# Patient Record
Sex: Female | Born: 1970 | Race: White | Hispanic: No | Marital: Married | Smoking: Never smoker
Health system: Southern US, Community
[De-identification: ages and names within clinical notes are randomized; demographics above are authoritative.]

## PROBLEM LIST (undated history)

## (undated) DIAGNOSIS — F32A Depression, unspecified: Secondary | ICD-10-CM

## (undated) DIAGNOSIS — R32 Unspecified urinary incontinence: Secondary | ICD-10-CM

## (undated) DIAGNOSIS — F329 Major depressive disorder, single episode, unspecified: Secondary | ICD-10-CM

## (undated) HISTORY — DX: Unspecified urinary incontinence: R32

## (undated) HISTORY — DX: Major depressive disorder, single episode, unspecified: F32.9

## (undated) HISTORY — DX: Depression, unspecified: F32.A

---

## 2000-02-25 ENCOUNTER — Other Ambulatory Visit: Admission: RE | Admit: 2000-02-25 | Discharge: 2000-02-25 | Payer: Self-pay | Admitting: Obstetrics and Gynecology

## 2001-03-08 ENCOUNTER — Other Ambulatory Visit: Admission: RE | Admit: 2001-03-08 | Discharge: 2001-03-08 | Payer: Self-pay | Admitting: Obstetrics and Gynecology

## 2002-03-08 ENCOUNTER — Other Ambulatory Visit: Admission: RE | Admit: 2002-03-08 | Discharge: 2002-03-08 | Payer: Self-pay | Admitting: Obstetrics and Gynecology

## 2002-09-19 ENCOUNTER — Inpatient Hospital Stay (HOSPITAL_COMMUNITY): Admission: AD | Admit: 2002-09-19 | Discharge: 2002-09-22 | Payer: Self-pay | Admitting: Obstetrics and Gynecology

## 2002-10-29 ENCOUNTER — Other Ambulatory Visit: Admission: RE | Admit: 2002-10-29 | Discharge: 2002-10-29 | Payer: Self-pay | Admitting: Obstetrics and Gynecology

## 2003-11-01 ENCOUNTER — Other Ambulatory Visit: Admission: RE | Admit: 2003-11-01 | Discharge: 2003-11-01 | Payer: Self-pay | Admitting: Obstetrics and Gynecology

## 2004-12-31 ENCOUNTER — Other Ambulatory Visit: Admission: RE | Admit: 2004-12-31 | Discharge: 2004-12-31 | Payer: Self-pay | Admitting: Obstetrics and Gynecology

## 2007-04-18 ENCOUNTER — Ambulatory Visit: Payer: Self-pay | Admitting: Urology

## 2008-05-03 HISTORY — PX: OTHER SURGICAL HISTORY: SHX169

## 2010-02-26 ENCOUNTER — Encounter: Admission: RE | Admit: 2010-02-26 | Discharge: 2010-02-26 | Payer: Self-pay | Admitting: Obstetrics and Gynecology

## 2011-04-07 ENCOUNTER — Other Ambulatory Visit: Payer: Self-pay | Admitting: Obstetrics and Gynecology

## 2011-04-07 DIAGNOSIS — R928 Other abnormal and inconclusive findings on diagnostic imaging of breast: Secondary | ICD-10-CM

## 2011-04-21 ENCOUNTER — Ambulatory Visit
Admission: RE | Admit: 2011-04-21 | Discharge: 2011-04-21 | Disposition: A | Discharge status: Home / Self Care | Payer: BC Managed Care – PPO | Source: Ambulatory Visit | Attending: Obstetrics and Gynecology | Admitting: Obstetrics and Gynecology

## 2011-04-21 DIAGNOSIS — R928 Other abnormal and inconclusive findings on diagnostic imaging of breast: Secondary | ICD-10-CM

## 2013-07-04 ENCOUNTER — Encounter: Payer: Self-pay | Admitting: Family Medicine

## 2013-07-04 ENCOUNTER — Ambulatory Visit (INDEPENDENT_AMBULATORY_CARE_PROVIDER_SITE_OTHER): Payer: Private Health Insurance - Indemnity | Admitting: Family Medicine

## 2013-07-04 VITALS — BP 130/85 | HR 58 | Temp 98.6°F | Ht 64.0 in | Wt 158.8 lb

## 2013-07-04 DIAGNOSIS — F3342 Major depressive disorder, recurrent, in full remission: Secondary | ICD-10-CM

## 2013-07-04 DIAGNOSIS — Z13220 Encounter for screening for lipoid disorders: Secondary | ICD-10-CM

## 2013-07-04 DIAGNOSIS — R5381 Other malaise: Secondary | ICD-10-CM

## 2013-07-04 DIAGNOSIS — R5383 Other fatigue: Secondary | ICD-10-CM

## 2013-07-04 DIAGNOSIS — R635 Abnormal weight gain: Secondary | ICD-10-CM

## 2013-07-04 DIAGNOSIS — Z131 Encounter for screening for diabetes mellitus: Secondary | ICD-10-CM

## 2013-07-04 LAB — BASIC METABOLIC PANEL
BUN: 12 mg/dL (ref 6–23)
CALCIUM: 9.2 mg/dL (ref 8.4–10.5)
CO2: 26 meq/L (ref 19–32)
CREATININE: 0.8 mg/dL (ref 0.4–1.2)
Chloride: 103 mEq/L (ref 96–112)
GFR: 84.97 mL/min (ref >60.00)
Glucose, Bld: 74 mg/dL (ref 70–99)
Potassium: 4.3 mEq/L (ref 3.5–5.1)
SODIUM: 138 meq/L (ref 135–145)

## 2013-07-04 LAB — CBC WITH DIFFERENTIAL/PLATELET
BASOS ABS: 0 10*3/uL (ref 0.0–0.1)
BASOS PCT: 0.5 % (ref 0.0–3.0)
EOS ABS: 0.2 10*3/uL (ref 0.0–0.7)
Eosinophils Relative: 3.7 % (ref 0.0–5.0)
HEMATOCRIT: 38.7 % (ref 36.0–46.0)
HEMOGLOBIN: 12.8 g/dL (ref 12.0–15.0)
LYMPHS ABS: 1.7 10*3/uL (ref 0.7–4.0)
LYMPHS PCT: 29 % (ref 12.0–46.0)
MCHC: 33.1 g/dL (ref 30.0–36.0)
MCV: 92.8 fl (ref 78.0–100.0)
MONO ABS: 0.5 10*3/uL (ref 0.1–1.0)
Monocytes Relative: 8.7 % (ref 3.0–12.0)
NEUTROS ABS: 3.3 10*3/uL (ref 1.4–7.7)
Neutrophils Relative %: 58.1 % (ref 43.0–77.0)
Platelets: 203 10*3/uL (ref 150.0–400.0)
RBC: 4.17 Mil/uL (ref 3.87–5.11)
RDW: 12.5 % (ref 11.5–14.6)
WBC: 5.7 10*3/uL (ref 4.5–10.5)

## 2013-07-04 LAB — LIPID PANEL
CHOL/HDL RATIO: 4
Cholesterol: 199 mg/dL (ref 0–200)
HDL: 56.3 mg/dL (ref >39.00)
LDL CALC: 125 mg/dL — AB (ref 0–99)
TRIGLYCERIDES: 91 mg/dL (ref 0.0–149.0)
VLDL: 18.2 mg/dL (ref 0.0–40.0)

## 2013-07-04 LAB — TSH: TSH: 1.18 u[IU]/mL (ref 0.35–5.50)

## 2013-07-04 LAB — HEPATIC FUNCTION PANEL
ALK PHOS: 52 U/L (ref 39–117)
ALT: 12 U/L (ref 0–35)
AST: 20 U/L (ref 0–37)
Albumin: 3.9 g/dL (ref 3.5–5.2)
BILIRUBIN DIRECT: 0 mg/dL (ref 0.0–0.3)
BILIRUBIN TOTAL: 0.7 mg/dL (ref 0.3–1.2)
Total Protein: 7.2 g/dL (ref 6.0–8.3)

## 2013-07-04 NOTE — Progress Notes (Signed)
Pre visit review using our clinic review tool, if applicable. No additional management support is needed unless otherwise documented below in the visit note. 

## 2013-07-04 NOTE — Patient Instructions (Signed)
F/u 1 year CPX

## 2013-07-04 NOTE — Progress Notes (Signed)
Date:  07/04/2013   Name:  Jill Boone   DOB:  [birth year]   MRN:  [id number]  Primary Physician:  Hannah Beat, MD   Chief Complaint: Establish Care   Subjective:   History of Present Illness:  Jill Boone is a 43 y.o. pleasant patient who presents with the following:  Gained 10 pounds in the last six months.  Working out 3-4 days a week, did not lose any weight.  Body mass index is 27.24 kg/(m^2).   Wt Readings from Last 3 Encounters:  07/04/13 158 lb 12 oz (72.009 kg)   She has been frustrated by this. Her mother does have a history notable for hypothyroidism. The patient never has previously.  Some left wrist pain for about 3 months.  No significant trauma history.  O/w normally healthy  Patient Active Problem List   Diagnosis Date Noted  . Major depressive disorder, recurrent, in full remission 07/08/2013    Past Medical History  Diagnosis Date  . Depression   . Chicken pox   . Urinary incontinence     Past Surgical History  Procedure Laterality Date  . Bladder tack  2010    History   Social History  . Marital Status: Unknown    Spouse Name: N/A    Number of Children: N/A  . Years of Education: N/A   Occupational History  . Not on file.   Social History Main Topics  . Smoking status: Never Smoker   . Smokeless tobacco: Never Used  . Alcohol Use: No  . Drug Use: No  . Sexual Activity: Not on file   Other Topics Concern  . Not on file   Social History Narrative  . No narrative on file    Family History  Problem Relation Age of Onset  . Heart disease Father   . Cancer Paternal Grandmother     No Known Allergies  Medication list has been reviewed and updated.  Review of Systems:   GEN: No acute illnesses, no fevers, chills. GI: No n/v/d, eating normally Pulm: No SOB Interactive and getting along well at home.  Otherwise, ROS is as per the HPI.  Objective:   Physical Examination: BP 130/85  Pulse 58  Temp(Src) 98.6  F (37 C) (Oral)  Ht 5\' 4"  (1.626 m)  Wt 158 lb 12 oz (72.009 kg)  BMI 27.24 kg/m2  LMP 07/02/2013  Ideal Body Weight: Weight in (lb) to have BMI = 25: 145.3   GEN: WDWN, NAD, Non-toxic, A & O x 3 HEENT: Atraumatic, Normocephalic. Neck supple. No masses, No LAD. Ears and Nose: No external deformity. CV: RRR, No M/G/R. No JVD. No thrill. No extra heart sounds. PULM: CTA B, no wheezes, crackles, rhonchi. No retractions. No resp. distress. No accessory muscle use. EXTR: No c/c/e NEURO Normal gait.  PSYCH: Normally interactive. Conversant. Not depressed or anxious appearing.  Calm demeanor.  LEFT wrist is grossly unremarkable with good normal extension, flexion, ulnar and radial deviation. Finkelstein's test is negative. All bones are grossly unremarkable. There is some mild tenderness in the dorsum of approximately the 1st or 2nd tendon sheaths.  Laboratory and Imaging Data: Results for orders placed in visit on 07/04/13  BASIC METABOLIC PANEL      Result Value Ref Range   Sodium 138  135 - 145 mEq/L   Potassium 4.3  3.5 - 5.1 mEq/L   Chloride 103  96 - 112 mEq/L   CO2 26  19 - 32 mEq/L  Glucose, Bld 74  70 - 99 mg/dL   BUN 12  6 - 23 mg/dL   Creatinine, Ser 0.8  0.4 - 1.2 mg/dL   Calcium 9.2  8.4 - 16.1 mg/dL   GFR 09.60  >45.40 mL/min  HEPATIC FUNCTION PANEL      Result Value Ref Range   Total Bilirubin 0.7  0.3 - 1.2 mg/dL   Bilirubin, Direct 0.0  0.0 - 0.3 mg/dL   Alkaline Phosphatase 52  39 - 117 U/L   AST 20  0 - 37 U/L   ALT 12  0 - 35 U/L   Total Protein 7.2  6.0 - 8.3 g/dL   Albumin 3.9  3.5 - 5.2 g/dL  CBC WITH DIFFERENTIAL      Result Value Ref Range   WBC 5.7  4.5 - 10.5 K/uL   RBC 4.17  3.87 - 5.11 Mil/uL   Hemoglobin 12.8  12.0 - 15.0 g/dL   HCT 98.1  19.1 - 47.8 %   MCV 92.8  78.0 - 100.0 fl   MCHC 33.1  30.0 - 36.0 g/dL   RDW 29.5  62.1 - 30.8 %   Platelets 203.0  150.0 - 400.0 K/uL   Neutrophils Relative % 58.1  43.0 - 77.0 %   Lymphocytes Relative  29.0  12.0 - 46.0 %   Monocytes Relative 8.7  3.0 - 12.0 %   Eosinophils Relative 3.7  0.0 - 5.0 %   Basophils Relative 0.5  0.0 - 3.0 %   Neutro Abs 3.3  1.4 - 7.7 K/uL   Lymphs Abs 1.7  0.7 - 4.0 K/uL   Monocytes Absolute 0.5  0.1 - 1.0 K/uL   Eosinophils Absolute 0.2  0.0 - 0.7 K/uL   Basophils Absolute 0.0  0.0 - 0.1 K/uL  TSH      Result Value Ref Range   TSH 1.18  0.35 - 5.50 uIU/mL  LIPID PANEL      Result Value Ref Range   Cholesterol 199  0 - 200 mg/dL   Triglycerides 65.7  0.0 - 149.0 mg/dL   HDL 84.69  >62.95 mg/dL   VLDL 28.4  0.0 - 13.2 mg/dL   LDL Cholesterol 440 (*) 0 - 99 mg/dL   Total CHOL/HDL Ratio 4       Assessment & Plan:   Weight gain - Plan: TSH  Other malaise and fatigue - Plan: Hepatic function panel, CBC with Differential, TSH  Screening for lipoid disorders - Plan: Lipid panel  Screening for diabetes mellitus - Plan: Basic metabolic panel  Major depressive disorder, recurrent, in full remission  Doing fairly well. We talked mostly about exercise, diet, and weight loss. Will also check some baseline laboratories on her, and these have returned and are normal.  I reassured her about her wrist. She R. He has a cockup wrist splint, and all I would do at this point would be to take some anti-inflammatories in use her wrist splint at nighttime.  New Prescriptions   No medications on file   Orders Placed This Encounter  Procedures  . Basic metabolic panel  . Hepatic function panel  . CBC with Differential  . TSH  . Lipid panel   Signed,  Karleen Hampshire T. Gadiel John, MD, CAQ Sports Medicine  [facility] [address] [place] [address] Phone: [phone] Fax: [phone]  Patient Instructions  F/u 1 year CPX   Patient's Medications  New Prescriptions   No medications  on file  Previous Medications   CITALOPRAM (CELEXA) 20 MG TABLET    Take 20 mg by mouth daily.   NORETHINDRONE-ETHINYL ESTRADIOL (JUNEL FE,GILDESS  FE,LOESTRIN FE) 1-20 MG-MCG TABLET    Take 1 tablet by mouth daily.  Modified Medications   No medications on file  Discontinued Medications   No medications on file

## 2013-07-05 ENCOUNTER — Encounter: Payer: Self-pay | Admitting: *Deleted

## 2013-07-08 ENCOUNTER — Encounter: Payer: Self-pay | Admitting: Family Medicine

## 2013-07-08 DIAGNOSIS — F3342 Major depressive disorder, recurrent, in full remission: Secondary | ICD-10-CM | POA: Insufficient documentation

## 2013-07-08 DIAGNOSIS — F330 Major depressive disorder, recurrent, mild: Secondary | ICD-10-CM | POA: Insufficient documentation

## 2016-06-04 ENCOUNTER — Encounter: Payer: Self-pay | Admitting: Family Medicine

## 2017-08-11 LAB — HM MAMMOGRAPHY

## 2017-08-11 LAB — HM PAP SMEAR: HM PAP: NEGATIVE

## 2017-10-26 ENCOUNTER — Encounter: Payer: Self-pay | Admitting: *Deleted

## 2017-10-27 ENCOUNTER — Encounter: Payer: Self-pay | Admitting: Family Medicine

## 2017-10-27 ENCOUNTER — Encounter

## 2017-10-27 ENCOUNTER — Ambulatory Visit (INDEPENDENT_AMBULATORY_CARE_PROVIDER_SITE_OTHER): Payer: 59 | Admitting: Family Medicine

## 2017-10-27 VITALS — BP 118/78 | HR 68 | Temp 98.6°F | Ht 64.0 in | Wt 172.5 lb

## 2017-10-27 DIAGNOSIS — Z6829 Body mass index (BMI) 29.0-29.9, adult: Secondary | ICD-10-CM | POA: Diagnosis not present

## 2017-10-27 DIAGNOSIS — F3342 Major depressive disorder, recurrent, in full remission: Secondary | ICD-10-CM

## 2017-10-27 DIAGNOSIS — Z6828 Body mass index (BMI) 28.0-28.9, adult: Secondary | ICD-10-CM | POA: Insufficient documentation

## 2017-10-27 DIAGNOSIS — Z6827 Body mass index (BMI) 27.0-27.9, adult: Secondary | ICD-10-CM | POA: Insufficient documentation

## 2017-10-27 DIAGNOSIS — E663 Overweight: Secondary | ICD-10-CM

## 2017-10-27 NOTE — Progress Notes (Signed)
Subjective:    Patient ID: Jill ParkerJulia C Boone, female    DOB: 13-Oct-1970, 47 y.o.   MRN: [id number]  HPI   47 year old female presents to establish care.  [facility]  Last CPX 2017 per chart.  08/2017 CPX with GYN pap, mammogram:  Dr. Rana SnareLowe [facility]od[facility]   MDD, recurrent in remission: Started postpartum.. Off and on since 1998. Stayed on off last child.  Well controlled on celexa low dose.    She is post menopausal since age 47. Had had weight gain, mood changes, lots of little issues.  Had big work up with endocrinologist, Dr. Talmage NapBalan.   She has worked on [employer] eating and exercise.  Started on juice plus last year.. This has helped a lot with symptoms, but cannot lose weight.   Low carb diet, phentermine has not helped.   Exercise: 3 times a week, walking, running, aerobic Diet: good Body mass index is 29.61 kg/m.   Wt Readings from Last 3 Encounters:  10/27/17 172 lb 8 oz (78.2 kg)  07/04/13 158 lb 12 oz (72 kg)    In last 6 month seeing chiropractor for left hip pain.   Blood pressure 118/78, pulse 68, temperature 98.6 F (37 C), temperature source Oral, height 5\' 4"  (1.626 m), weight 172 lb 8 oz (78.2 kg), SpO2 98 %.  Social History /Family History/Past Medical History reviewed in detail and updated in EMR if needed.  Review of Systems  Constitutional: Negative for fatigue and fever.  HENT: Negative for congestion.   Eyes: Negative for pain.  Respiratory: Negative for cough and shortness of breath.   Cardiovascular: Negative for chest pain, palpitations and leg swelling.  Gastrointestinal: Negative for abdominal pain.  Genitourinary: Negative for dysuria and vaginal bleeding.  Musculoskeletal: Negative for back pain.  Neurological: Negative for syncope, light-headedness and headaches.  Psychiatric/Behavioral: Negative for dysphoric mood.       Objective:   Physical Exam  Constitutional: Vital signs are normal. She appears well-developed  and well-nourished. She is cooperative.  Non-toxic appearance. She does not appear ill. No distress.  Overweight   HENT:  Head: Normocephalic.  Right Ear: Hearing, tympanic membrane, external ear and ear canal normal.  Left Ear: Hearing, tympanic membrane, external ear and ear canal normal.  Nose: Nose normal.  Eyes: Pupils are equal, round, and reactive to light. Conjunctivae, EOM and lids are normal. Lids are everted and swept, no foreign bodies found.  Neck: Trachea normal and normal range of motion. Neck supple. Carotid bruit is not present. No thyroid mass and no thyromegaly present.  Cardiovascular: Normal rate, regular rhythm, S1 normal, S2 normal, normal heart sounds and intact distal pulses. Exam reveals no gallop.  No murmur heard. Pulmonary/Chest: Effort normal and breath sounds normal. No respiratory distress. She has no wheezes. She has no rhonchi. She has no rales.  Abdominal: Soft. Normal appearance and bowel sounds are normal. She exhibits no distension, no fluid wave, no abdominal bruit and no mass. There is no hepatosplenomegaly. There is no tenderness. There is no rebound, no guarding and no CVA tenderness. No hernia.  Lymphadenopathy:    She has no cervical adenopathy.    She has no axillary adenopathy.  Neurological: She is alert. She has normal strength. No cranial nerve deficit or sensory deficit.  Skin: Skin is warm, dry and intact. No rash noted.  Psychiatric: Her speech is normal and behavior is normal. Judgment normal. Her mood appears not anxious. Cognition  and memory are normal. She does not exhibit a depressed mood.          Assessment & Plan:

## 2017-10-27 NOTE — Patient Instructions (Signed)
Return for fasting labs only in next weeks for cholesterol and CMET.

## 2017-10-27 NOTE — Assessment & Plan Note (Signed)
Well controlled on celexa. 

## 2017-10-27 NOTE — Assessment & Plan Note (Signed)
She has had extensive eval for unexpected weight gain.  May be menopausal. Has tried phentermine, HRT etc wihtout benefit. HAS had ENDo eval.  She really just wants to know why it has happened.  Is some better with juice plus. Good diet. regular exercise.

## 2017-11-01 ENCOUNTER — Encounter: Payer: Self-pay | Admitting: Family Medicine

## 2017-11-24 ENCOUNTER — Telehealth: Payer: Self-pay | Admitting: Family Medicine

## 2017-11-24 DIAGNOSIS — Z13220 Encounter for screening for lipoid disorders: Secondary | ICD-10-CM

## 2017-11-24 NOTE — Telephone Encounter (Signed)
-----   Message from Alvina Chouerri J Walsh sent at 11/23/2017 10:35 AM EDT ----- Regarding: Lab orders for Monday, 7.29.19 Lab orders, no f/u appt

## 2017-11-28 ENCOUNTER — Other Ambulatory Visit (INDEPENDENT_AMBULATORY_CARE_PROVIDER_SITE_OTHER): Payer: 59

## 2017-11-28 DIAGNOSIS — Z13220 Encounter for screening for lipoid disorders: Secondary | ICD-10-CM

## 2017-11-28 LAB — COMPREHENSIVE METABOLIC PANEL
AG Ratio: 1.9 (calc) (ref 1.0–2.5)
ALKALINE PHOSPHATASE (APISO): 99 U/L (ref 33–115)
ALT: 12 U/L (ref 6–29)
AST: 17 U/L (ref 10–35)
Albumin: 4.5 g/dL (ref 3.6–5.1)
BILIRUBIN TOTAL: 0.6 mg/dL (ref 0.2–1.2)
BUN: 13 mg/dL (ref 7–25)
CHLORIDE: 104 mmol/L (ref 98–110)
CO2: 32 mmol/L (ref 20–32)
CREATININE: 0.78 mg/dL (ref 0.50–1.10)
Calcium: 9.8 mg/dL (ref 8.6–10.2)
Globulin: 2.4 g/dL (calc) (ref 1.9–3.7)
Glucose, Bld: 99 mg/dL (ref 65–99)
POTASSIUM: 5.1 mmol/L (ref 3.5–5.3)
Sodium: 143 mmol/L (ref 135–146)
Total Protein: 6.9 g/dL (ref 6.1–8.1)

## 2017-11-28 LAB — LIPID PANEL
CHOL/HDL RATIO: 3.4 (calc) (ref <5.0)
CHOLESTEROL: 213 mg/dL — AB (ref <200)
HDL: 63 mg/dL (ref >50)
LDL Cholesterol (Calc): 123 mg/dL (calc) — ABNORMAL HIGH
NON-HDL CHOLESTEROL (CALC): 150 mg/dL — AB (ref <130)
TRIGLYCERIDES: 154 mg/dL — AB (ref <150)

## 2017-11-28 NOTE — Addendum Note (Signed)
Addended by: Alvina ChouWALSH, TERRI J on: 11/28/2017 08:27 AM   Modules accepted: Orders

## 2017-11-28 NOTE — Addendum Note (Signed)
Addended by: WALSH, TERRI J on: 11/28/2017 08:27 AM   Modules accepted: Orders  

## 2017-11-29 ENCOUNTER — Other Ambulatory Visit: Payer: 59

## 2017-12-20 ENCOUNTER — Other Ambulatory Visit: Payer: Self-pay | Admitting: Unknown Physician Specialty

## 2017-12-20 ENCOUNTER — Ambulatory Visit
Admission: RE | Admit: 2017-12-20 | Discharge: 2017-12-20 | Disposition: A | Discharge status: Home / Self Care | Payer: 59 | Source: Ambulatory Visit | Attending: Unknown Physician Specialty | Admitting: Unknown Physician Specialty

## 2017-12-20 DIAGNOSIS — M26609 Unspecified temporomandibular joint disorder, unspecified side: Secondary | ICD-10-CM

## 2017-12-20 DIAGNOSIS — M26601 Right temporomandibular joint disorder, unspecified: Secondary | ICD-10-CM | POA: Insufficient documentation

## 2017-12-20 DIAGNOSIS — H6091 Unspecified otitis externa, right ear: Secondary | ICD-10-CM | POA: Diagnosis not present

## 2017-12-20 MED ORDER — IOHEXOL 300 MG/ML  SOLN
75.0000 mL | Freq: Once | INTRAMUSCULAR | Status: AC | PRN
  Administered 2017-12-20: 75 mL via INTRAVENOUS

## 2018-09-15 ENCOUNTER — Other Ambulatory Visit: Payer: Self-pay

## 2018-09-15 ENCOUNTER — Ambulatory Visit (INDEPENDENT_AMBULATORY_CARE_PROVIDER_SITE_OTHER): Payer: 59 | Admitting: Family Medicine

## 2018-09-15 ENCOUNTER — Encounter: Payer: Self-pay | Admitting: Family Medicine

## 2018-09-15 VITALS — BP 130/90 | HR 71 | Temp 98.9°F | Ht 64.0 in | Wt 160.2 lb

## 2018-09-15 DIAGNOSIS — M79671 Pain in right foot: Secondary | ICD-10-CM | POA: Diagnosis not present

## 2018-09-15 DIAGNOSIS — M25542 Pain in joints of left hand: Secondary | ICD-10-CM

## 2018-09-15 DIAGNOSIS — M79672 Pain in left foot: Secondary | ICD-10-CM | POA: Diagnosis not present

## 2018-09-15 DIAGNOSIS — M25541 Pain in joints of right hand: Secondary | ICD-10-CM | POA: Insufficient documentation

## 2018-09-15 NOTE — Progress Notes (Signed)
Subjective:    Patient ID: Jill Boone, female    DOB: Jul 14, 1970, 48 y.o.   MRN: [id number]  HPI   48 year old female present for evaluation of foot and hand pain.   She reports  She ha s noted 1 year of stiffness in hands and feet.. much worse in recent months. When she wakes up in the morning she is stiff.. it takes more than 1 hour to move stiffness out.  She denies swelling an redness in joints.  Hard to pinpoint what joints in hands worst.. MCP and DIP. Mild wrist stiffness. No ankle sprain. Feet feel tight.. cannot pinpoint specific joints.  She feels that right hand grip is decreased.  She types  A lot on computer. No numbness.   No other joints involved.  She has tried muscle relaxer, tylenol and ibuprofen off and on for TMJ.. did not help joints.   no known family history of arthritis.  No personal history of psoriasis. No current rash  Social History /Family History/Past Medical History reviewed in detail and updated in EMR if needed. Blood pressure 130/90, pulse 71, temperature 98.9 F (37.2 C), temperature source Oral, height 5\' 4"  (1.626 m), weight 160 lb 4 oz (72.7 kg), last menstrual period 07/02/2013.  Review of Systems  Constitutional: Negative for fatigue and fever.  HENT: Negative for congestion.   Eyes: Negative for pain.  Respiratory: Negative for cough and shortness of breath.   Cardiovascular: Negative for chest pain, palpitations and leg swelling.  Gastrointestinal: Negative for abdominal pain.  Genitourinary: Negative for dysuria and vaginal bleeding.  Musculoskeletal: Negative for back pain.  Neurological: Negative for syncope, light-headedness and headaches.  Psychiatric/Behavioral: Negative for dysphoric mood.       Objective:   Physical Exam Constitutional:      General: She is not in acute distress.    Appearance: Normal appearance. She is well-developed. She is not ill-appearing or toxic-appearing.  HENT:     Head: Normocephalic.    Right Ear: Hearing, tympanic membrane, ear canal and external ear normal. Tympanic membrane is not erythematous, retracted or bulging.     Left Ear: Hearing, tympanic membrane, ear canal and external ear normal. Tympanic membrane is not erythematous, retracted or bulging.     Nose: No mucosal edema or rhinorrhea.     Right Sinus: No maxillary sinus tenderness or frontal sinus tenderness.     Left Sinus: No maxillary sinus tenderness or frontal sinus tenderness.     Mouth/Throat:     Pharynx: Uvula midline.  Eyes:     General: Lids are normal. Lids are everted, no foreign bodies appreciated.     Conjunctiva/sclera: Conjunctivae normal.     Pupils: Pupils are equal, round, and reactive to light.  Neck:     Musculoskeletal: Normal range of motion and neck supple.     Thyroid: No thyroid mass or thyromegaly.     Vascular: No carotid bruit.     Trachea: Trachea normal.  Cardiovascular:     Rate and Rhythm: Normal rate and regular rhythm.     Pulses: Normal pulses.     Heart sounds: Normal heart sounds, S1 normal and S2 normal. No murmur. No friction rub. No gallop.   Pulmonary:     Effort: Pulmonary effort is normal. No tachypnea or respiratory distress.     Breath sounds: Normal breath sounds. No decreased breath sounds, wheezing, rhonchi or rales.  Abdominal:     General: Bowel sounds are normal.  Palpations: Abdomen is soft.     Tenderness: There is no abdominal tenderness.  Musculoskeletal:     Right wrist: Normal. She exhibits normal range of motion and no tenderness.     Left wrist: Normal. She exhibits normal range of motion, no tenderness and no bony tenderness.     Right ankle: Normal.     Left ankle: Normal.     Right hand: She exhibits decreased range of motion. Normal sensation noted. Normal strength noted.     Left hand: She exhibits decreased range of motion. Normal sensation noted. Normal strength noted.     Right foot: Normal.     Left foot: No tenderness.  Skin:     General: Skin is warm and dry.     Findings: No rash.  Neurological:     Mental Status: She is alert.  Psychiatric:        Mood and Affect: Mood is not anxious or depressed.        Speech: Speech normal.        Behavior: Behavior normal. Behavior is cooperative.        Thought Content: Thought content normal.        Judgment: Judgment normal.           Assessment & Plan:

## 2018-09-15 NOTE — Assessment & Plan Note (Signed)
Moderate concern level for autoimmune arthritis given age, symmetry, small joints. Exam was unremarkable and did not show current inflammation in joints.  Send for labs eval.  Use NSAIDs prn.

## 2018-09-15 NOTE — Patient Instructions (Signed)
Please stop at the lab to have labs drawn.  

## 2018-09-19 LAB — CYCLIC CITRUL PEPTIDE ANTIBODY, IGG: Cyclic Citrullin Peptide Ab: <16 UNITS

## 2018-09-19 LAB — RHEUMATOID FACTOR: Rhuematoid fact SerPl-aCnc: <14 IU/mL (ref <14)

## 2018-09-19 LAB — C-REACTIVE PROTEIN: CRP: 1.6 mg/L (ref <8.0)

## 2018-09-19 LAB — ANA: Anti Nuclear Antibody (ANA): NEGATIVE

## 2018-09-19 LAB — SEDIMENTATION RATE: Sed Rate: 2 mm/h (ref 0–20)

## 2018-11-08 DIAGNOSIS — Z87898 Personal history of other specified conditions: Secondary | ICD-10-CM

## 2018-11-09 ENCOUNTER — Telehealth: Payer: Self-pay | Admitting: Family Medicine

## 2018-11-09 DIAGNOSIS — Z13220 Encounter for screening for lipoid disorders: Secondary | ICD-10-CM

## 2018-11-09 NOTE — Telephone Encounter (Signed)
-----   Message from Tamera L Johnson, RT sent at 11/09/2018  3:58 PM EDT ----- Regarding: Lab Orders for Friday 7.10.2020 Please place lab orders for Friday 7.10.2020,  office visit for physical on Tuesday 7.14.2020 Thank you, Tamera Johnson RT(R)      

## 2018-11-09 NOTE — Telephone Encounter (Signed)
-----   Message from Cloyd Stagers, RT sent at 11/09/2018  3:58 PM EDT ----- Regarding: Lab Orders for Friday 7.10.2020 Please place lab orders for Friday 7.10.2020,  office visit for physical on Tuesday 7.14.2020 Thank you, Dyke Maes RT(R)

## 2018-11-10 ENCOUNTER — Other Ambulatory Visit (INDEPENDENT_AMBULATORY_CARE_PROVIDER_SITE_OTHER): Payer: 59

## 2018-11-10 DIAGNOSIS — Z13220 Encounter for screening for lipoid disorders: Secondary | ICD-10-CM

## 2018-11-10 DIAGNOSIS — Z111 Encounter for screening for respiratory tuberculosis: Secondary | ICD-10-CM

## 2018-11-10 DIAGNOSIS — Z87898 Personal history of other specified conditions: Secondary | ICD-10-CM

## 2018-11-12 LAB — COMPREHENSIVE METABOLIC PANEL
AG Ratio: 2 (calc) (ref 1.0–2.5)
ALT: 12 U/L (ref 6–29)
AST: 18 U/L (ref 10–35)
Albumin: 4.6 g/dL (ref 3.6–5.1)
Alkaline phosphatase (APISO): 93 U/L (ref 37–153)
BUN: 11 mg/dL (ref 7–25)
CO2: 30 mmol/L (ref 20–32)
Calcium: 9.8 mg/dL (ref 8.6–10.4)
Chloride: 104 mmol/L (ref 98–110)
Creat: 0.81 mg/dL (ref 0.50–1.05)
Globulin: 2.3 g/dL (calc) (ref 1.9–3.7)
Glucose, Bld: 89 mg/dL (ref 65–99)
Potassium: 4.2 mmol/L (ref 3.5–5.3)
Sodium: 142 mmol/L (ref 135–146)
Total Bilirubin: 0.3 mg/dL (ref 0.2–1.2)
Total Protein: 6.9 g/dL (ref 6.1–8.1)

## 2018-11-12 LAB — LIPID PANEL
Cholesterol: 181 mg/dL (ref <200)
HDL: 58 mg/dL (ref >=50)
LDL Cholesterol (Calc): 107 mg/dL (calc) — ABNORMAL HIGH
Non-HDL Cholesterol (Calc): 123 mg/dL (calc) (ref <130)
Total CHOL/HDL Ratio: 3.1 (calc) (ref <5.0)
Triglycerides: 75 mg/dL (ref <150)

## 2018-11-12 LAB — QUANTIFERON-TB GOLD PLUS
Mitogen-NIL: >10 IU/mL
NIL: 0.02 IU/mL
QuantiFERON-TB Gold Plus: NEGATIVE
TB1-NIL: 0 IU/mL
TB2-NIL: 0.01 IU/mL

## 2018-11-12 LAB — HEMOGLOBIN A1C
Hgb A1c MFr Bld: 5.6 % of total Hgb (ref <5.7)
Mean Plasma Glucose: 114 (calc)
eAG (mmol/L): 6.3 (calc)

## 2018-11-14 ENCOUNTER — Encounter: Payer: 59 | Admitting: Family Medicine

## 2018-11-16 ENCOUNTER — Other Ambulatory Visit: Payer: Self-pay

## 2018-11-16 DIAGNOSIS — Z20822 Contact with and (suspected) exposure to covid-19: Secondary | ICD-10-CM

## 2018-11-21 LAB — NOVEL CORONAVIRUS, NAA: SARS-CoV-2, NAA: NOT DETECTED

## 2018-12-12 ENCOUNTER — Other Ambulatory Visit: Payer: Self-pay

## 2018-12-12 ENCOUNTER — Encounter: Payer: Self-pay | Admitting: Family Medicine

## 2018-12-12 ENCOUNTER — Ambulatory Visit (INDEPENDENT_AMBULATORY_CARE_PROVIDER_SITE_OTHER): Payer: 59 | Admitting: Family Medicine

## 2018-12-12 VITALS — BP 120/80 | HR 68 | Temp 98.1°F | Ht 64.0 in | Wt 163.8 lb

## 2018-12-12 DIAGNOSIS — Z1211 Encounter for screening for malignant neoplasm of colon: Secondary | ICD-10-CM | POA: Diagnosis not present

## 2018-12-12 DIAGNOSIS — Z6828 Body mass index (BMI) 28.0-28.9, adult: Secondary | ICD-10-CM | POA: Diagnosis not present

## 2018-12-12 DIAGNOSIS — Z0000 Encounter for general adult medical examination without abnormal findings: Secondary | ICD-10-CM | POA: Diagnosis not present

## 2018-12-12 NOTE — Patient Instructions (Addendum)
Return stool cards for colon cancer testing.  Call [place] [facility] [facility] etc when insurance changes.

## 2018-12-12 NOTE — Progress Notes (Signed)
  Chief Complaint  Patient presents with  . Annual Exam    History of Present Illness: HPI The patient is here for annual wellness exam and preventative care.   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  MDD: stable control on celexa  Depression screen [facility] 2/9 12/12/2018 10/27/2017  Decreased Interest 0 0  Down, Depressed, Hopeless 0 0  PHQ - 2 Score 0 0    TMJ .. uses flexeril as needed.   She has been working on weight loss.. had lost some weight in last year.  She has been exercising  3 times week. Saw endo in past.. dx with prediabetes. No other recommendations.   Wt Readings from Last 3 Encounters:  12/12/18 163 lb 12 oz (74.3 kg)  09/15/18 160 lb 4 oz (72.7 kg)  10/27/17 172 lb 8 oz (78.2 kg)  Body mass index is 28.11 kg/m.   She continues to have hand  and feet pain..  Neg AN< RF, anticcp antibody, Sed rate.  COVID 19 screen No recent travel or known exposure to COVID19 The patient denies respiratory symptoms of COVID 19 at this time.  The importance of social distancing was discussed today.   Review of Systems  Constitutional: Negative for chills, fever and malaise/fatigue.  HENT: Negative for congestion and ear pain.   Eyes: Negative for pain and redness.  Respiratory: Negative for cough and shortness of breath.   Cardiovascular: Negative for chest pain, palpitations and leg swelling.  Gastrointestinal: Negative for abdominal pain, blood in stool, constipation, diarrhea, nausea and vomiting.  Genitourinary: Negative for dysuria.  Musculoskeletal: Negative for falls and myalgias.  Skin: Negative for rash.  Neurological: Negative for dizziness.  Psychiatric/Behavioral: Negative for depression. The patient is not nervous/anxious.       Past Medical History:  Diagnosis Date  . Depression   . Urinary incontinence     reports that she has never smoked. She has never used smokeless tobacco. She reports that she does not drink alcohol or use drugs.    Current Outpatient Medications:  .  citalopram (CELEXA) 20 MG tablet, Take 20 mg by mouth daily., Disp: , Rfl:  .  cyclobenzaprine (FLEXERIL) 5 MG tablet, Take 5 mg by mouth at bedtime as needed. , Disp: , Rfl:  .  OVER THE COUNTER MEDICATION, Juice Plus: Veggie, Fruit,  Berry & Omega Blend 2 capsules daily of each blend, Disp: , Rfl:    Observations/Objective: Blood pressure 120/80, pulse 68, temperature 98.1 F (36.7 C), temperature source Temporal, height 5\' 4"  (1.626 m), weight 163 lb 12 oz (74.3 kg), last menstrual period 07/02/2013, SpO2 98 %.  Physical Exam   Assessment and Plan  The patient's preventative maintenance and recommended screening tests for an annual wellness exam were reviewed in full today. Brought up to date unless services declined.  Counselled on the importance of diet, exercise, and its role in overall health and mortality. The patient's FH and SH was reviewed, including their home life, tobacco status, and drug and alcohol status.      Vaccines: uptodate Pap/DVE: 08/2017 Mammo: 08/2017 Bone Density:none Colon: Smoking Status: ETOH/ drug JOI:[facility]./[facility]  HIV screen:   refused    Eliezer Lofts, MD

## 2018-12-12 NOTE — Assessment & Plan Note (Signed)
She is frustrated with her hard work on weight loss and minimal change in weight.  She is worried she may have insulin resistance and would like to consider a weight loss med once she has new insurance.

## 2018-12-17 ENCOUNTER — Other Ambulatory Visit: Payer: Self-pay | Admitting: Family Medicine

## 2018-12-18 ENCOUNTER — Telehealth: Payer: Self-pay | Admitting: Family Medicine

## 2018-12-18 NOTE — Telephone Encounter (Signed)
Patient stated that she was sent home with an insure one chemical test  On her paperwork that she needs to send in with it. It is asking the DX Code, [facility] number and the MPI number. She would like to know if someone could call her with this information.  Patient also stated that she is needing to have this sent back within 48 hours so she would like a call back as soon as possible.    C/B # [phone] Patient would like a detailed message lefted if she does not answer.

## 2018-12-18 NOTE — Telephone Encounter (Signed)
LVM for patient to call my direct line so I could get more information

## 2018-12-18 NOTE — Addendum Note (Signed)
Addended by: Ellamae Sia on: 12/18/2018 03:12 PM   Modules accepted: Orders

## 2018-12-22 LAB — FECAL GLOBIN BY IMMUNOCHEMISTRY
FECAL GLOBIN RESULT:: NOT DETECTED
MICRO NUMBER:: 793315
SPECIMEN QUALITY:: ADEQUATE

## 2019-05-30 ENCOUNTER — Other Ambulatory Visit: Payer: 59

## 2019-05-30 ENCOUNTER — Ambulatory Visit: Payer: 59 | Attending: Internal Medicine

## 2019-05-30 ENCOUNTER — Telehealth: Payer: Self-pay

## 2019-05-30 DIAGNOSIS — Z20822 Contact with and (suspected) exposure to covid-19: Secondary | ICD-10-CM

## 2019-05-30 NOTE — Telephone Encounter (Signed)
My chart appt already made for 05/30/19 at 8:30 for covid test at [facility]. FYI to Dr Ermalene Searing and Lupita Leash CMA.

## 2019-05-30 NOTE — Telephone Encounter (Signed)
[facility] Night - Client TELEPHONE ADVICE RECORD [employer] Patient Name: Jill Boone Gender: Female DOB: 11/20/1970 Age: 49 Y 8 M Return Phone Number: [phone] (Primary) Address: City/State/Zip: [place] [address] Client [facility] Night - Client Client Site [facility] - Night Physician Kerby Nora - MD Contact Type Call Who Is Calling Patient / Member / Family / Caregiver Call Type Triage / Clinical Relationship To Patient Self Return Phone Number [phone] (Primary) Chief Complaint Headache Reason for Call Symptomatic / Request for Health Information Initial Comment Caller states that she was wanting to know what the testing process is since she has been sick for three days. She has been having a headache, nausea, fatigue, sore throat and low grade fever. Translation No Nurse Assessment Nurse: May, RN, Tammy Date/Time Lamount Cohen Time): 05/30/2019 8:01:50 AM Confirm and document reason for call. If symptomatic, describe symptoms. ---Caller states she is having headache, nausea, fatigue, sore throat and low grade fever since Sunday. Caller states she has been quarantined since Sunday. Caller has questions about covid. Has the patient had close contact with a person known or suspected to have the novel coronavirus illness OR traveled / lives in area with major community spread (including international travel) in the last 14 days from the onset of symptoms? * If Asymptomatic, screen for exposure and travel within the last 14 days. ---Yes Does the patient have any new or worsening symptoms? ---Yes Will a triage be completed? ---Yes Related visit to physician within the last 2 weeks? ---No Does the PT have any chronic conditions? (i.e. diabetes, asthma, this includes High risk factors for pregnancy, etc.) ---No Is the patient pregnant or possibly pregnant? (Ask all females between the ages of  80-55) ---No Is this a behavioral health or substance abuse call? ---No Guidelines Guideline Title Affirmed Question Affirmed Notes Nurse Date/Time (Eastern Time) Coronavirus (COVID-19) - Diagnosed or Suspected [1] COVID-19 infection suspected by caller or triager AND [2] mild symptoms (cough, fever, May, RN, Tammy 05/30/2019 8:04:34 AM PLEASE NOTE: All timestamps contained within this report are represented as [place] Standard Time. CONFIDENTIALTY NOTICE: This fax transmission is intended only for the addressee. It contains information that is legally privileged, confidential or otherwise protected from use or disclosure. If you are not the intended recipient, you are strictly prohibited from reviewing, disclosing, copying using or disseminating any of this information or taking any action in reliance on or regarding this information. If you have received this fax in error, please notify us immediately by telephone so that we can arrange for its return to [place]. Phone: [phone], Toll-Free: [phone], Fax: [phone] Page: 2 of 2 Call Id: [id number] Guidelines Guideline Title Affirmed Question Affirmed Notes Nurse Date/Time Lamount Cohen Time) or others) AND [3] no complications or SOB Disp. Time Lamount Cohen Time) Disposition Final User 05/30/2019 8:12:56 AM Call PCP when Office is Open Yes May, RN, Tammy Caller Disagree/Comply Comply Caller Understands Yes PreDisposition Did not know what to do Care Advice Given Per Guideline CALL PCP WHEN OFFICE IS OPEN: * Call the office when it is open. REASSURANCE AND EDUCATION - SUSPECTED COVID-19: * You suspect you have COVID-19 because you have symptoms that match and you were either exposed to someone with it or because it widespread in your community. * Most people who get COVID-19 will have mild illness, can recover at home without medical care, and may not need to be tested. From what you have told me, your symptoms are  mild. That is  reassuring. * Talk with your healthcare provider about your symptoms. Ask if testing is needed. * Call them during regular office hours. GENERAL CARE ADVICE FOR COVID-19 SYMPTOMS: * Fever: For fever over 101 F (38.3 C), take acetaminophen every 4 to 6 hours (Adults 650 mg) OR ibuprofen every 6-8 hours (Adults 400 mg). Before taking any medicine, read all the instructions on the package. Do not take aspirin unless your doctor has prescribed it for you. * Feeling dehydrated: Drink extra liquids. If the air in your home is dry, use a humidifier. * Cough: Use cough drops. * The treatment is the same whether you have COVID-19, influenza or some other respiratory virus. * Muscle aches, headache, and other pains: Often this comes and goes with the fever. Take acetaminophen every 4-6 hours (Adults 650 mg) OR ibuprofen every 6 to 8 hours (Adults 400 mg). Before taking any medicine, read all the instructions on the package. * Sore throat: Try throat lozenges, hard candy or warm chicken broth. HUMIDIFIER: * If the air is dry, use a humidifier in the bedroom. HOW TO PROTECT OTHERS - WHEN YOU ARE SICK WITH COVID-19: * STAY HOME A MINIMUM OF 10 DAYS: Home isolation is needed for at least 10 days after the symptoms started. Stay home from school or work if you are sick. Do NOT go to religious services, child care centers, shopping, or other public places. Do NOT use public transportation (e.g., bus, taxis, ride-sharing). Do NOT allow any visitors to your home. Leave the house only if you need to seek urgent medical care. * COVER THE COUGH: Cough and sneeze into your shirt sleeve or inner elbow. Don't cough into your hand or the air. If available, cough into a tissue and throw it into a trash can. * [place] HANDS OFTEN: Wash hands often with soap and water. After coughing or sneezing are important times. If soap and water are not available, use an alcohol-based hand sanitizer with at least 60% alcohol, covering all  surfaces of your hands and rubbing them together until they feel dry. Avoid touching your eyes, nose, and mouth with unwashed hands. * WEAR A MASK: Wear a facemask when around others. Always wear a facemask (if available) if you have to leave your home (such as going to a medical facility). * CALL FIRST IF MEDICAL CARE NEEDED: Call ahead to get approval and careful directions. CALL BACK IF: * Fever over 103 F (39.4 C) * Fever lasts over 3 days * Chest pain or difficulty breathing occurs * You become worse. CARE ADVICE given per CORONAVIRUS (COVID-19) - DIAGNOSED OR SUSPECTED (Adult) guideline. Referrals REFERRED TO PCP OFFICE

## 2019-05-31 LAB — NOVEL CORONAVIRUS, NAA: SARS-CoV-2, NAA: NOT DETECTED

## 2019-06-01 ENCOUNTER — Ambulatory Visit (INDEPENDENT_AMBULATORY_CARE_PROVIDER_SITE_OTHER): Payer: BC Managed Care – PPO | Admitting: Family Medicine

## 2019-06-01 ENCOUNTER — Encounter: Payer: Self-pay | Admitting: Family Medicine

## 2019-06-01 VITALS — Temp 98.6°F | Ht 64.0 in | Wt 167.0 lb

## 2019-06-01 DIAGNOSIS — R509 Fever, unspecified: Secondary | ICD-10-CM

## 2019-06-01 NOTE — Patient Instructions (Addendum)
Check blood pressure... goal < 140/90.  Increasing fluids ideally, water.  Ibuprofen 800 mg three times daily for headache with  food.

## 2019-06-01 NOTE — Progress Notes (Signed)
VIRTUAL VISIT Due to national recommendations of social distancing due to COVID 19, a virtual visit is felt to be most appropriate for this patient at this time.   I connected with the patient on 06/01/19 at  4:00 PM EST by virtual telehealth platform and verified that I am speaking with the correct person using two identifiers.   I discussed the limitations, risks, security and privacy concerns of performing an evaluation and management service by  virtual telehealth platform and the availability of in person appointments. I also discussed with the patient that there may be a patient responsible charge related to this service. The patient expressed understanding and agreed to proceed.  Patient location: Home Provider Location: [facility] Participants: Kerby Nora and Ron Parker   Chief Complaint  Patient presents with  . Headache    Tested negative for Covid 05/30/2019  . Nausea  . Generalized Body Aches    History of Present Illness:  49 year old female presents with new onset headache, nausea and  focal body aches.  She reports her symptoms started with  frontal headache , upset stomach 5 days ago.  She works in Public affairs consultant.Sensitive to light and sounds, felt dizziness after being on Zoom all day.   2 days ago woke up with scratchy throat and low grade fever 100.4 F ( otheritmes it was negative she thinks it may have been a ne error).  Neg COVID PCR test 05/30/2019. worked remotely.    No further fever, no cough no congestion, no SOB.   Achy in  neck and upper backs.  Frontal..  No personal history of migraine.  Brother and sister with migraine.   She has tried  Aleve 2 tabs, tylenol extra strenght... no relief.   no new neuro changes  no numbness, no weakness. No vision changes.   no falls.   She is on thyroid medications.. she has started some 2 months of new supplements.  COVID 19 screen No recent travel or known exposure to COVID19 The patient denies  respiratory symptoms of COVID 19 at this time.  The importance of social distancing was discussed today.   Review of Systems  Constitutional: Positive for fever. Negative for chills.  HENT: Positive for sore throat. Negative for congestion and ear pain.   Eyes: Negative for pain and redness.  Respiratory: Negative for cough and shortness of breath.   Cardiovascular: Negative for chest pain, palpitations and leg swelling.  Gastrointestinal: Positive for nausea. Negative for abdominal pain, blood in stool, constipation, diarrhea and vomiting.  Genitourinary: Negative for dysuria.  Musculoskeletal: Positive for myalgias. Negative for falls.  Skin: Negative for rash.  Neurological: Positive for headaches. Negative for dizziness.  Psychiatric/Behavioral: Negative for depression. The patient is not nervous/anxious.       Past Medical History:  Diagnosis Date  . Depression   . Urinary incontinence     reports that she has never smoked. She has never used smokeless tobacco. She reports that she does not drink alcohol or use drugs.   Current Outpatient Medications:  .  ARMOUR THYROID 30 MG tablet, Take 30 mg by mouth daily., Disp: , Rfl:  .  citalopram (CELEXA) 20 MG tablet, Take 20 mg by mouth daily., Disp: , Rfl:  .  cyclobenzaprine (FLEXERIL) 5 MG tablet, Take 5 mg by mouth at bedtime as needed. , Disp: , Rfl:  .  OVER THE COUNTER MEDICATION, Juice Plus: Veggie, Fruit,  Berry & Omega Blend 2 capsules daily  of each blend, Disp: , Rfl:    Observations/Objective: Temperature 98.6 F (37 C), temperature source Tympanic, height 5\' 4"  (1.626 m), weight 167 lb (75.8 kg), last menstrual period 07/02/2013.  Physical Exam  Physical Exam Constitutional:      General: The patient is not in acute distress. Pulmonary:     Effort: Pulmonary effort is normal. No respiratory distress.  Neurological:     Mental Status: The patient is alert and oriented to person, place, and time.  Psychiatric:         Mood and Affect: Mood normal.        Behavior: Behavior normal.   Assessment and Plan    I discussed the assessment and treatment plan with the patient. The patient was provided an opportunity to ask questions and all were answered. The patient agreed with the plan and demonstrated an understanding of the instructions.   The patient was advised to call back or seek an in-person evaluation if the symptoms worsen or if the condition fails to improve as anticipated.  Fever No clear sign of bacterial illness but strep in differential. Likely viral source.  Neg COVID PCR test, possibly flu.  Symptomatic care and if not improving call for appt at respiratory clinic.  Recent BP elevation:  follow at home    Eliezer Lofts, MD

## 2019-07-04 DIAGNOSIS — R509 Fever, unspecified: Secondary | ICD-10-CM | POA: Insufficient documentation

## 2019-07-04 NOTE — Assessment & Plan Note (Signed)
No clear sign of bacterial illness but strep in differential. Likely viral source.  Neg COVID PCR test, possibly flu.  Symptomatic care and if not improving call for appt at respiratory clinic.

## 2019-09-18 DIAGNOSIS — E039 Hypothyroidism, unspecified: Secondary | ICD-10-CM | POA: Insufficient documentation

## 2019-10-12 LAB — EXTERNAL GENERIC LAB PROCEDURE: COLOGUARD: NEGATIVE

## 2019-10-19 LAB — HM DEXA SCAN

## 2019-10-30 IMAGING — CT CT MAXILLOFACIAL W/ CM
3 series · 16 of 47 positions shown, 19 images · IV contrast (omnipaque)
Comparison: None.

CLINICAL DATA: Right ear pain for 1 month. Weakness and head pain
beginning last night.

"Temporomandibular joint dysfunction"
EXAM:
CT MAXILLOFACIAL WITH CONTRAST
TECHNIQUE: Multidetector CT imaging of the maxillofacial structures was
performed with intravenous contrast. Multiplanar CT image
reconstructions were also generated.
CONTRAST:  75mL OMNIPAQUE IOHEXOL 300 MG/ML  SOLN

[Series 2: max soft (person_name) · axial · 0.34mm/px · z∈[-170,-22]mm · 10 of 86 slices shown, 13 images]
[im 6/86  brain]
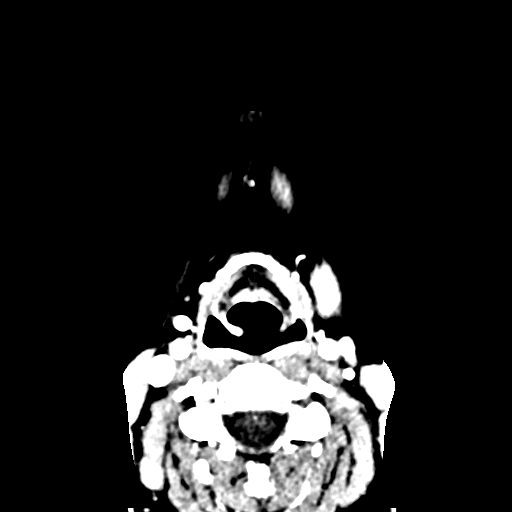
[im 6/86  bone]
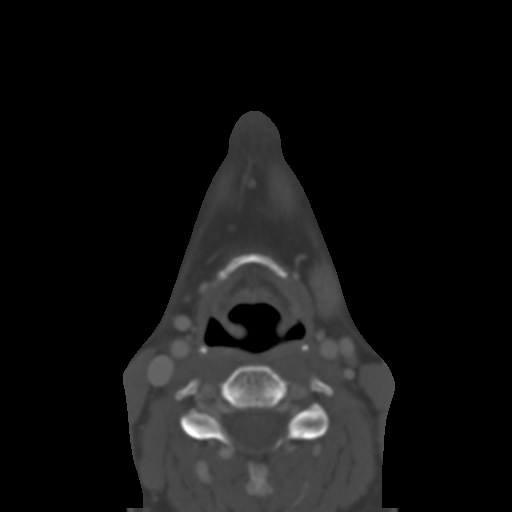
[im 15/86  bone]
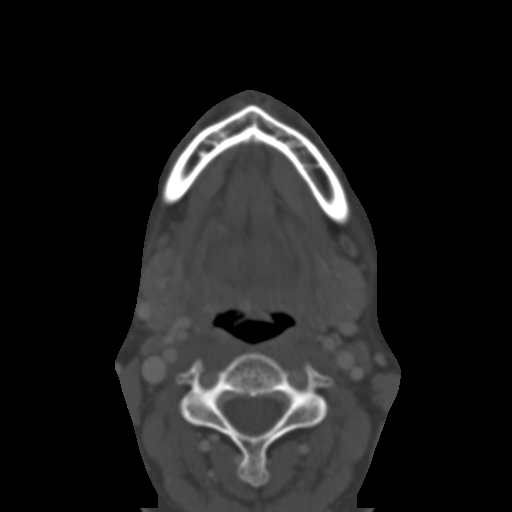
[im 24/86  bone]
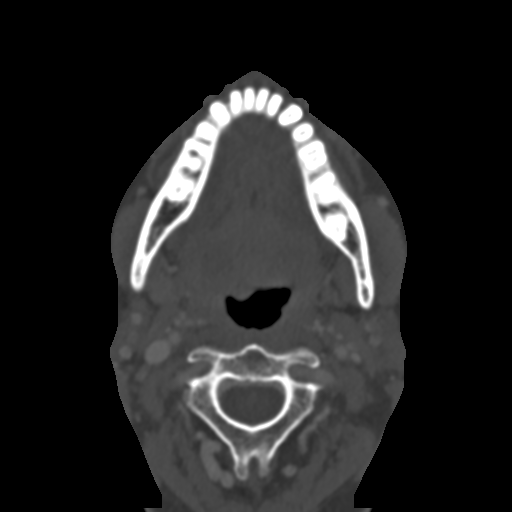
[im 30/86  bone]
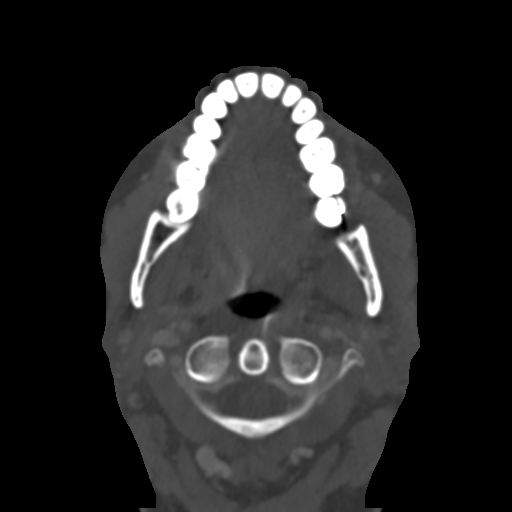
[im 39/86  brain]
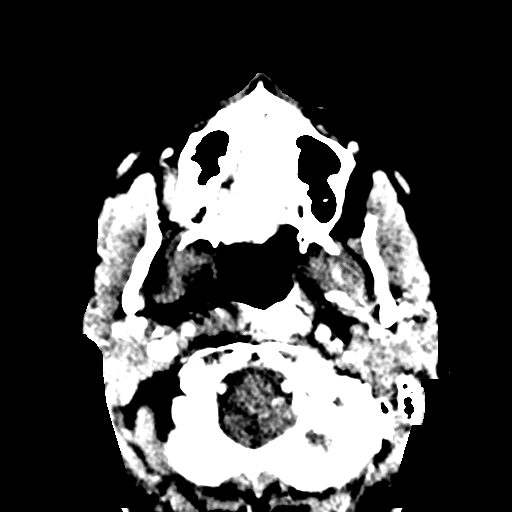
[im 39/86  bone]
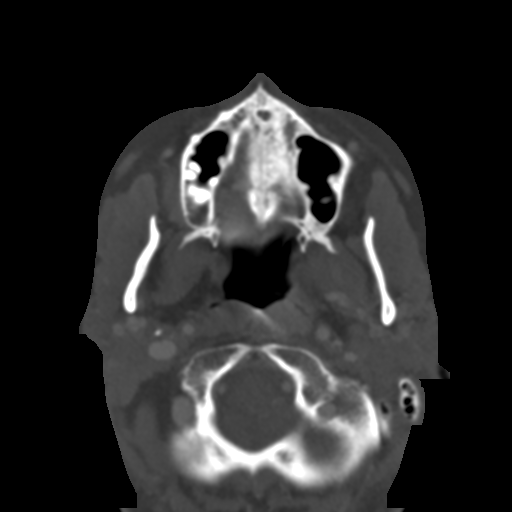
[im 47/86  bone]
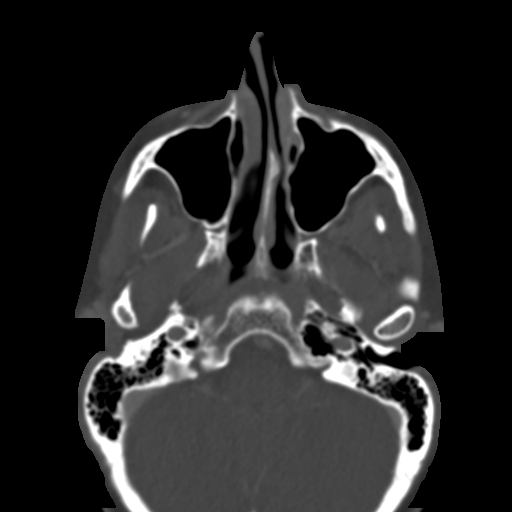
[im 56/86  bone]
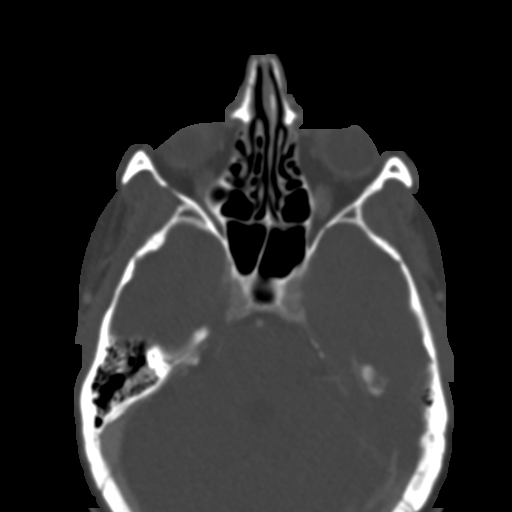
[im 65/86  bone]
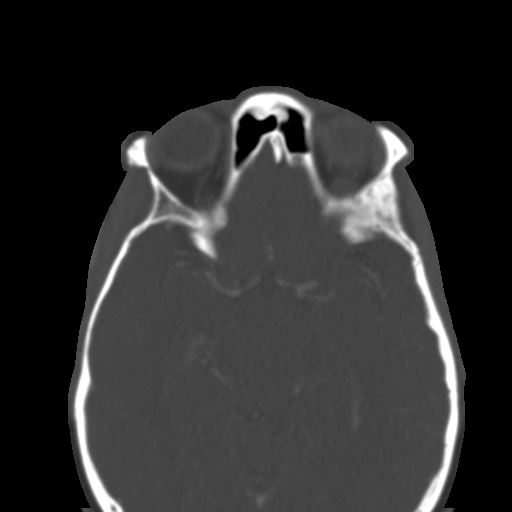
[im 71/86  brain]
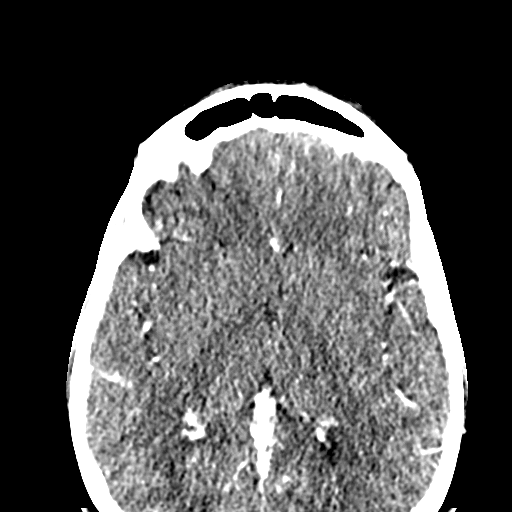
[im 71/86  bone]
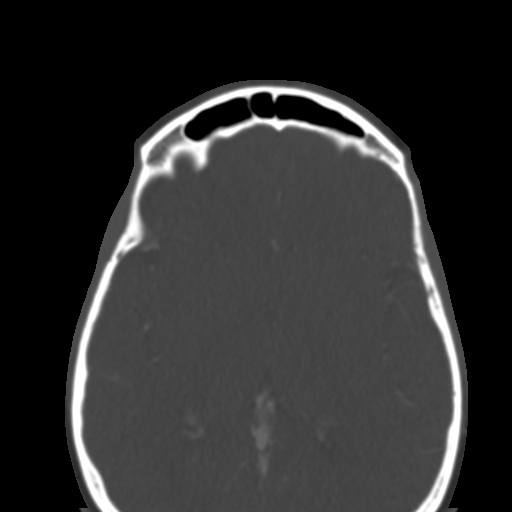
[im 80/86  bone]
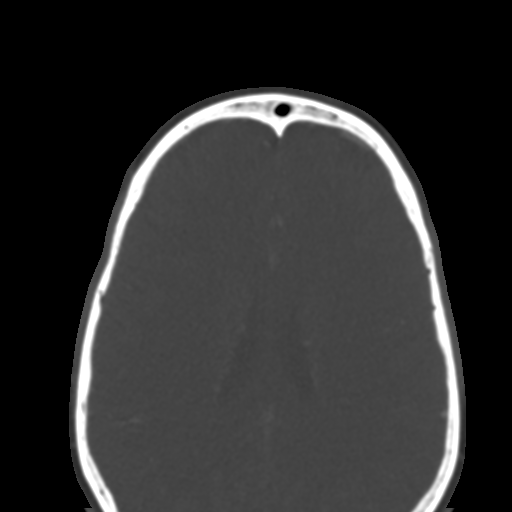

[Series 4: coronal soft · coronal · 0.32mm/px · 3 of 82 slices shown]
[im 28/82  bone]
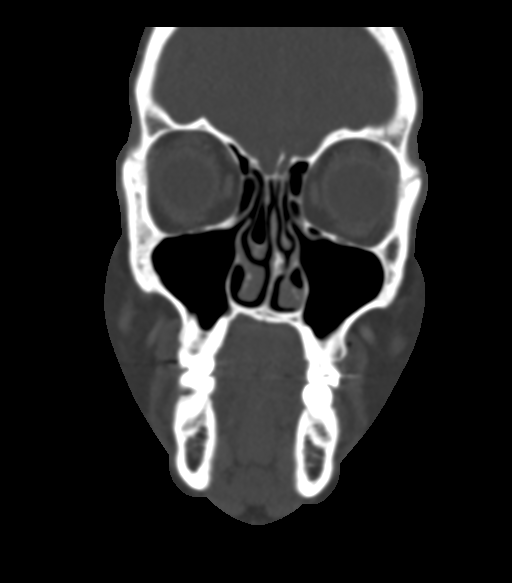
[im 37/82  bone]
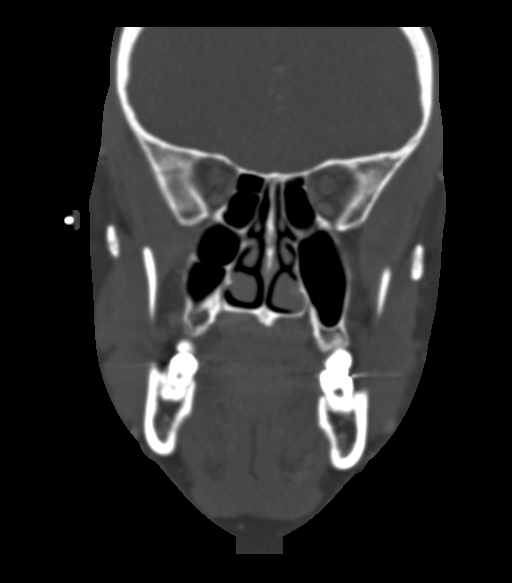
[im 46/82  bone]
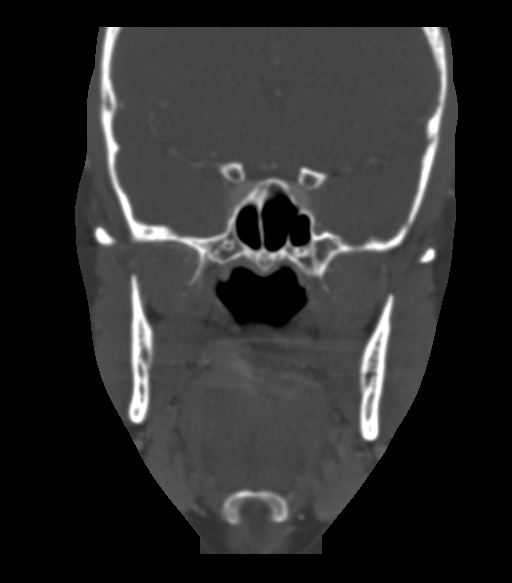

[Series 8: sagittal soft · sagittal · 0.33mm/px · 3 of 77 slices shown]
[im 26/77  bone]
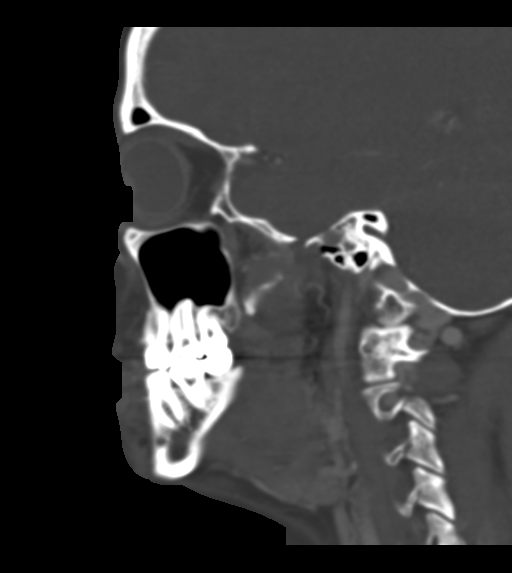
[im 39/77  bone]
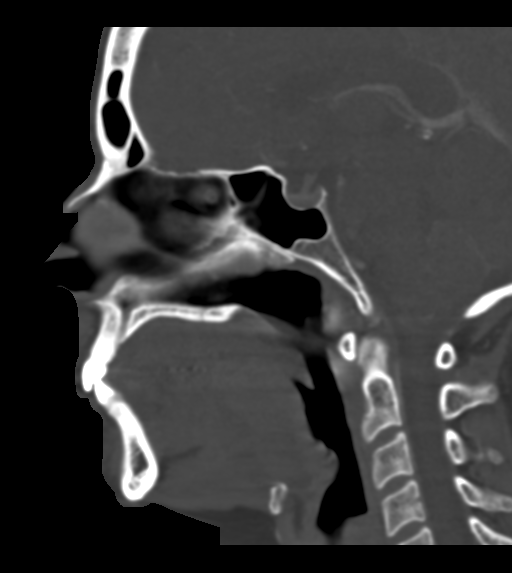
[im 51/77  bone]
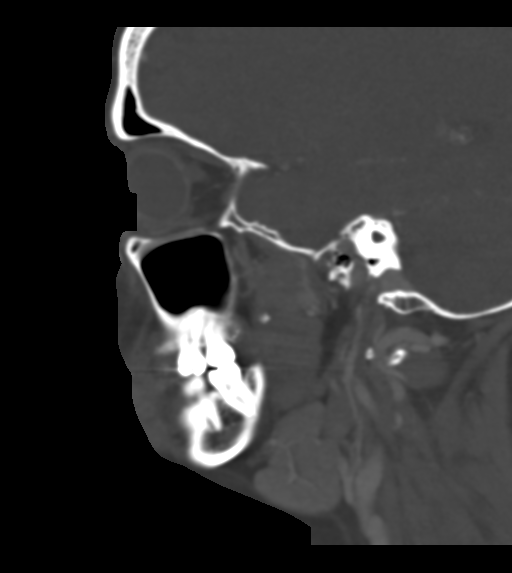

[16 of 47 positions shown; findings below may reference images not displayed]

FINDINGS: Osseous: No fracture or mandibular dislocation. No destructive
process. No degenerative change, erosion, or malalignment at the
temporomandibular joints.

Orbits: Negative. No traumatic or inflammatory finding.

Sinuses: Clear.

Soft tissues: There is avid enhancement of the cartilaginous
external auditory canal on the right. No abscess or bone erosion. No
mastoid or middle ear opacification.

Limited intracranial: No pathologic finding. Proximal basilar
fenestration.
IMPRESSION: Uncomplicated otitis externa on the right.

## 2019-10-31 NOTE — Telephone Encounter (Signed)
Pt had appt with DR Ermalene Searing 06/01/19 .

## 2020-10-29 LAB — HM PAP SMEAR: HM Pap smear: NEGATIVE

## 2020-10-29 LAB — HM MAMMOGRAPHY

## 2020-10-29 LAB — RESULTS CONSOLE HPV: CHL HPV: NEGATIVE

## 2021-08-21 ENCOUNTER — Ambulatory Visit: Payer: BC Managed Care – PPO | Admitting: Nurse Practitioner

## 2021-08-21 ENCOUNTER — Encounter: Payer: Self-pay | Admitting: Nurse Practitioner

## 2021-08-21 VITALS — BP 128/84 | HR 77 | Temp 97.9°F | Resp 12 | Wt 161.4 lb

## 2021-08-21 DIAGNOSIS — J069 Acute upper respiratory infection, unspecified: Secondary | ICD-10-CM | POA: Insufficient documentation

## 2021-08-21 DIAGNOSIS — R051 Acute cough: Secondary | ICD-10-CM | POA: Insufficient documentation

## 2021-08-21 DIAGNOSIS — R0982 Postnasal drip: Secondary | ICD-10-CM | POA: Diagnosis not present

## 2021-08-21 MED ORDER — FLUTICASONE PROPIONATE 50 MCG/ACT NA SUSP: 2.0000 | Freq: Every day | NASAL | 0 refills | Status: DC

## 2021-08-21 MED ORDER — GUAIFENESIN-CODEINE 100-10 MG/5ML PO SOLN: 5.0000 mL | Freq: Three times a day (TID) | ORAL | 0 refills | Status: AC | PRN

## 2021-08-21 NOTE — Assessment & Plan Note (Signed)
Patient signs and symptoms consistent with upper respiratory infection.  Do not think we need antibiotics at this time did discuss with patient 7 to 10-day time period.  Patient was concerned and wanted an antibiotic deferred until patient went to get more time she will reach out to me on Monday to review her systems via MyChart and we can consider antibiotic use at that point.  Patient is concerned she is going out of town to this coming Wednesday.  Follow-up if no improvement ?

## 2021-08-21 NOTE — Patient Instructions (Signed)
Nice to see you today ?I have sent in the nose spray and cough medication ?Reach out to me on Monday to evaluate the symptoms via my chart ?Follow up if no improvement  ?

## 2021-08-21 NOTE — Assessment & Plan Note (Signed)
Likely contributing to some of cough.  We will start fluticasone 2 sprays each nostril once daily.  Did give fluticasone precautions in regards to inciting epistaxis. ?

## 2021-08-21 NOTE — Assessment & Plan Note (Signed)
Will write codeine-guaifenesin cough syrup 5 mL 3 times daily for 7 days.  Follow-up if no improvement ?

## 2021-08-21 NOTE — Progress Notes (Signed)
? ?Acute Office Visit ? ?Subjective:  ? ?  ?Patient ID: Jill Boone, female    DOB: [birth year], 51 y.o.   MRN: [id number] ? ?Chief Complaint  ?Patient presents with  ? Sinus Problem  ?  Sx started around 08/17/21-Sore throat, cough, post nasal drip. No headache or sinus pain, no fever, no body aches or chills. Has been taking allergy medication and OTC cough syrup.  ? ? ? ?Patient is in today for  ?Symptoms started Monday ?No sick contacts ?Covid test today that was negative ?No covid vaccine ?No flu vaccine ?Has been taking allergy medication and cough syrup ? ?Cough equal throughout the day ? ?Review of Systems  ?Constitutional:  Negative for chills and fever.  ?HENT:  Positive for sore throat. Negative for congestion, ear discharge, ear pain and sinus pain.   ?Respiratory:  Positive for cough (non productive). Negative for shortness of breath.   ?Cardiovascular:  Negative for chest pain.  ?Gastrointestinal:  Negative for abdominal pain, diarrhea, nausea and vomiting.  ?Musculoskeletal:  Negative for joint pain and myalgias.  ?Neurological:  Negative for headaches.  ? ? ?   ?Objective:  ?  ?BP 128/84   Pulse 77   Temp 97.9 ?F (36.6 ?C)   Resp 12   Wt 161 lb 6 oz (73.2 kg)   LMP 07/02/2013   SpO2 97%   BMI 27.70 kg/m?  ? ? ?Physical Exam ?Vitals and nursing note reviewed.  ?Constitutional:   ?   Appearance: Normal appearance.  ?HENT:  ?   Right Ear: Tympanic membrane, ear canal and external ear normal.  ?   Left Ear: Tympanic membrane, ear canal and external ear normal.  ?   Nose:  ?   Right Sinus: No maxillary sinus tenderness or frontal sinus tenderness.  ?   Left Sinus: Maxillary sinus tenderness present. No frontal sinus tenderness.  ?   Mouth/Throat:  ?   Mouth: Mucous membranes are moist.  ?   Pharynx: Oropharynx is clear.  ?Cardiovascular:  ?   Rate and Rhythm: Normal rate and regular rhythm.  ?   Heart sounds: Normal heart sounds.  ?Pulmonary:  ?   Breath sounds: Normal breath sounds.  ?Abdominal:  ?    General: Bowel sounds are normal.  ?Lymphadenopathy:  ?   Cervical: No cervical adenopathy.  ?Neurological:  ?   Mental Status: She is alert.  ? ? ?No results found for any visits on 08/21/21. ? ? ?   ?Assessment & Plan:  ? ?Problem List Items Addressed This Visit   ? ?  ? Respiratory  ? Upper respiratory tract infection - Primary  ?  Patient signs and symptoms consistent with upper respiratory infection.  Do not think we need antibiotics at this time did discuss with patient 7 to 10-day time period.  Patient was concerned and wanted an antibiotic deferred until patient went to get more time she will reach out to me on Monday to review her systems via MyChart and we can consider antibiotic use at that point.  Patient is concerned she is going out of town to this coming Wednesday.  Follow-up if no improvement ? ?  ?  ? Relevant Medications  ? guaiFENesin-codeine 100-10 MG/5ML syrup  ? fluticasone (FLONASE) 50 MCG/ACT nasal spray  ?  ? Other  ? PND (post-nasal drip)  ?  Likely contributing to some of cough.  We will start fluticasone 2 sprays each nostril once daily.  Did give fluticasone precautions in  regards to inciting epistaxis. ? ?  ?  ? Relevant Medications  ? fluticasone (FLONASE) 50 MCG/ACT nasal spray  ? Acute cough  ?  Will write codeine-guaifenesin cough syrup 5 mL 3 times daily for 7 days.  Follow-up if no improvement ? ?  ?  ? Relevant Medications  ? guaiFENesin-codeine 100-10 MG/5ML syrup  ? ? ?Meds ordered this encounter  ?Medications  ? guaiFENesin-codeine 100-10 MG/5ML syrup  ?  Sig: Take 5 mLs by mouth 3 (three) times daily as needed for up to 7 days for cough.  ?  Dispense:  105 mL  ?  Refill:  0  ?  Order Specific Question:   Supervising Provider  ?  Answer:   Roxy Manns A [[id number]]  ? fluticasone (FLONASE) 50 MCG/ACT nasal spray  ?  Sig: Place 2 sprays into both nostrils daily.  ?  Dispense:  16 g  ?  Refill:  0  ?  Order Specific Question:   Supervising Provider  ?  Answer:   Roxy Manns A  [[id number]]  ? ? ?No follow-ups on file. ? ?Audria Nine, NP ? ? ?

## 2021-08-24 ENCOUNTER — Encounter: Payer: Self-pay | Admitting: Nurse Practitioner

## 2021-08-24 DIAGNOSIS — J069 Acute upper respiratory infection, unspecified: Secondary | ICD-10-CM

## 2021-08-24 MED ORDER — AZITHROMYCIN 250 MG PO TABS: ORAL_TABLET | ORAL | 0 refills | Status: AC

## 2021-08-25 ENCOUNTER — Other Ambulatory Visit: Payer: Self-pay | Admitting: Nurse Practitioner

## 2021-08-25 DIAGNOSIS — R0982 Postnasal drip: Secondary | ICD-10-CM

## 2021-08-25 DIAGNOSIS — J069 Acute upper respiratory infection, unspecified: Secondary | ICD-10-CM

## 2021-09-10 LAB — CBC AND DIFFERENTIAL
HCT: 38 (ref 36–46)
Hemoglobin: 13.4 (ref 12.0–16.0)
Neutrophils Absolute: 2.5
Platelets: 230 10*3/uL (ref 150–400)
WBC: 4.9

## 2021-09-10 LAB — BASIC METABOLIC PANEL
BUN: 9 (ref 4–21)
CO2: 23 — AB (ref 13–22)
Chloride: 103 (ref 99–108)
Creatinine: 0.9 (ref 0.5–1.1)
Glucose: 84
Potassium: 4 mEq/L (ref 3.5–5.1)
Sodium: 140 (ref 137–147)

## 2021-09-10 LAB — VITAMIN D 25 HYDROXY (VIT D DEFICIENCY, FRACTURES): Vit D, 25-Hydroxy: 82.5

## 2021-09-10 LAB — HEPATIC FUNCTION PANEL
ALT: 8 U/L (ref 7–35)
AST: 17 (ref 13–35)
Alkaline Phosphatase: 96 (ref 25–125)
Bilirubin, Total: 4

## 2021-09-10 LAB — COMPREHENSIVE METABOLIC PANEL
Albumin: 4.8 (ref 3.5–5.0)
Calcium: 9.3 (ref 8.7–10.7)
Globulin: 2.2
eGFR: 80

## 2021-09-10 LAB — TSH: TSH: 0.42 (ref 0.41–5.90)

## 2021-09-10 LAB — HEMOGLOBIN A1C: Hemoglobin A1C: 5.3

## 2021-09-10 LAB — CBC: RBC: 4.36 (ref 3.87–5.11)

## 2021-11-24 ENCOUNTER — Ambulatory Visit (INDEPENDENT_AMBULATORY_CARE_PROVIDER_SITE_OTHER): Payer: BC Managed Care – PPO | Admitting: Family Medicine

## 2021-11-24 ENCOUNTER — Encounter: Payer: Self-pay | Admitting: Family Medicine

## 2021-11-24 VITALS — BP 114/80 | HR 77 | Temp 98.5°F | Ht 64.0 in | Wt 160.1 lb

## 2021-11-24 DIAGNOSIS — N951 Menopausal and female climacteric states: Secondary | ICD-10-CM | POA: Insufficient documentation

## 2021-11-24 DIAGNOSIS — F330 Major depressive disorder, recurrent, mild: Secondary | ICD-10-CM

## 2021-11-24 DIAGNOSIS — Z1159 Encounter for screening for other viral diseases: Secondary | ICD-10-CM

## 2021-11-24 DIAGNOSIS — R0982 Postnasal drip: Secondary | ICD-10-CM

## 2021-11-24 DIAGNOSIS — E039 Hypothyroidism, unspecified: Secondary | ICD-10-CM

## 2021-11-24 DIAGNOSIS — Z6827 Body mass index (BMI) 27.0-27.9, adult: Secondary | ICD-10-CM

## 2021-11-24 DIAGNOSIS — Z13220 Encounter for screening for lipoid disorders: Secondary | ICD-10-CM

## 2021-11-24 DIAGNOSIS — Z0000 Encounter for general adult medical examination without abnormal findings: Secondary | ICD-10-CM | POA: Diagnosis not present

## 2021-11-24 MED ORDER — VENLAFAXINE HCL ER 37.5 MG PO CP24: ORAL_CAPSULE | ORAL | 4 refills | Status: DC

## 2021-11-24 MED ORDER — CETIRIZINE-PSEUDOEPHEDRINE ER 5-120 MG PO TB12: 1.0000 | ORAL_TABLET | Freq: Two times a day (BID) | ORAL | 1 refills | Status: DC

## 2021-11-24 NOTE — Assessment & Plan Note (Signed)
Chronic, followed by alternative medicine clinic.  She is on levothyroxine and Armour Thyroid but is transitioning to a new thyroid medication.

## 2021-11-24 NOTE — Assessment & Plan Note (Signed)
She is followed at an alternative medicine clinic and is maintained on progesterone.

## 2021-11-24 NOTE — Assessment & Plan Note (Signed)
Improved control on Zyrtec daily.

## 2021-11-24 NOTE — Assessment & Plan Note (Signed)
Encouraged exercise, weight loss, healthy eating habits. ? ?

## 2021-11-24 NOTE — Patient Instructions (Signed)
Consider Shingrix vaccine.

## 2021-11-24 NOTE — Assessment & Plan Note (Addendum)
Chronic, well controlled on Celexa 20 mg p.o. daily but she feels she has had associated side effect of weight gain.  She is interested in changing to an medication but does not have this weight gain as associated side effects. She will decrease to half tablet daily of Celexa for 3 days then if doing well she will transition to venlafaxine 37.5 mg daily.  She will continue this for 1 week, if tolerated she will increase to 75 mg daily.  She will follow-up in 4 to 5 weeks for reevaluation of mood and side effects on the new regimen.

## 2021-11-24 NOTE — Progress Notes (Signed)
Patient ID: Jill Boone, female    DOB: 11/15/1970, 51 y.o.   MRN: [id number]  This visit was conducted in person.  BP 114/80   Pulse 77   Temp 98.5 F (36.9 C) (Oral)   Ht 5\' 4"  (1.626 m)   Wt 160 lb 2 oz (72.6 kg)   LMP 07/02/2013   SpO2 97%   BMI 27.49 kg/m    CC:  Chief Complaint  Patient presents with   Annual Exam    Subjective:   HPI: Jill Boone is a 51 y.o. female presenting on 11/24/2021 for Annual Exam  Has had labs 09/11/2021.. reviewed in detail.  Diet:  healthy  Exercise: 2-3 times a week.  GYN: Dr.  11/11/2021.. as an appt set up in a few weeks.  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 daily. She has been on this for 20 years.. she has noted weight gain over the past several years. She wpuld like to change medication.    11/24/2021   12:23 PM 12/12/2018    4:30 PM 10/27/2017    8:34 AM  PHQ9 SCORE ONLY  PHQ-9 Total Score 4 0 0     Allergic rhinitis: She is requesting Zyrtec D.  Refilled.  Blood pressure well controlled.   Hypothyroid, chronic, well controlled on levothyroxine 25 mcg p.o. daily, armour thyroid 60 mg    Per [facility] labs 08/2021  TSH 0.415,  She is finishing out what she has given cost of armour thyroid and polan to increase the levo.  Wt Readings from Last 3 Encounters:  11/24/21 160 lb 2 oz (72.6 kg)  08/21/21 161 lb 6 oz (73.2 kg)  06/01/19 167 lb (75.8 kg)         Relevant past medical, surgical, family and social history reviewed and updated as indicated. Interim medical history since our last visit reviewed. Allergies and medications reviewed and updated. Outpatient Medications Prior to Visit  Medication Sig Dispense Refill   Berberine Chloride (BERBERINE HCI PO) Take 450 mg by mouth daily.     Calcium Carbonate-Vit D-Min (CALCIUM 1200 PO) Take 1 tablet by mouth daily.     citalopram (CELEXA) 20 MG tablet Take 20 mg by mouth daily.     cyclobenzaprine (FLEXERIL) 5 MG tablet Take 5 mg by mouth at bedtime as  needed.      DHEA 25 MG CAPS Take 1 capsule by mouth daily.     estradiol (ESTRACE) 1 MG tablet Take 3 mg by mouth daily.     levothyroxine (SYNTHROID) 25 MCG tablet Take 12.5 mcg by mouth every morning.     Magnesium 500 MG TABS Take 1 tablet by mouth daily.     progesterone (PROMETRIUM) 100 MG capsule Take 300 mg by mouth at bedtime.     vitamin B-12 (CYANOCOBALAMIN) 1000 MCG tablet Take 1,000 mcg by mouth daily.     Vitamin D-Vitamin K (K2 PLUS D3 PO) Take by mouth daily. 750 iu/200 mcg     ARMOUR THYROID 30 MG tablet Take 30 mg by mouth daily.     fluticasone (FLONASE) 50 MCG/ACT nasal spray Place 2 sprays into both nostrils daily. 16 g 0   No facility-administered medications prior to visit.     Per HPI unless specifically indicated in ROS section below Review of Systems  Constitutional:  Negative for fatigue and fever.  HENT:  Negative for congestion.   Eyes:  Negative for pain.  Respiratory:  Negative for cough  and shortness of breath.   Cardiovascular:  Negative for chest pain, palpitations and leg swelling.  Gastrointestinal:  Negative for abdominal pain.  Genitourinary:  Negative for dysuria and vaginal bleeding.  Musculoskeletal:  Negative for back pain.  Neurological:  Negative for syncope, light-headedness and headaches.  Psychiatric/Behavioral:  Negative for dysphoric mood.    Objective:  BP 114/80   Pulse 77   Temp 98.5 F (36.9 C) (Oral)   Ht 5\' 4"  (1.626 m)   Wt 160 lb 2 oz (72.6 kg)   LMP 07/02/2013   SpO2 97%   BMI 27.49 kg/m   Wt Readings from Last 3 Encounters:  11/24/21 160 lb 2 oz (72.6 kg)  08/21/21 161 lb 6 oz (73.2 kg)  06/01/19 167 lb (75.8 kg)      Physical Exam Constitutional:      General: She is not in acute distress.    Appearance: Normal appearance. She is well-developed. She is not ill-appearing or toxic-appearing.  HENT:     Head: Normocephalic.     Right Ear: Hearing, tympanic membrane, ear canal and external ear normal. Tympanic  membrane is not erythematous, retracted or bulging.     Left Ear: Hearing, tympanic membrane, ear canal and external ear normal. Tympanic membrane is not erythematous, retracted or bulging.     Nose: Nose normal. No mucosal edema or rhinorrhea.     Right Sinus: No maxillary sinus tenderness or frontal sinus tenderness.     Left Sinus: No maxillary sinus tenderness or frontal sinus tenderness.     Mouth/Throat:     Pharynx: Uvula midline.  Eyes:     General: Lids are normal. Lids are everted, no foreign bodies appreciated.     Conjunctiva/sclera: Conjunctivae normal.     Pupils: Pupils are equal, round, and reactive to light.  Neck:     Thyroid: No thyroid mass or thyromegaly.     Vascular: No carotid bruit.     Trachea: Trachea normal.  Cardiovascular:     Rate and Rhythm: Normal rate and regular rhythm.     Pulses: Normal pulses.     Heart sounds: Normal heart sounds, S1 normal and S2 normal. No murmur heard.    No friction rub. No gallop.  Pulmonary:     Effort: Pulmonary effort is normal. No tachypnea or respiratory distress.     Breath sounds: Normal breath sounds. No decreased breath sounds, wheezing, rhonchi or rales.  Abdominal:     General: Bowel sounds are normal. There is no distension or abdominal bruit.     Palpations: Abdomen is soft. There is no fluid wave or mass.     Tenderness: There is no abdominal tenderness. There is no guarding or rebound.     Hernia: No hernia is present.  Musculoskeletal:     Cervical back: Normal range of motion and neck supple.  Lymphadenopathy:     Cervical: No cervical adenopathy.  Skin:    General: Skin is warm and dry.     Findings: No rash.  Neurological:     Mental Status: She is alert.     Cranial Nerves: No cranial nerve deficit.     Sensory: No sensory deficit.  Psychiatric:        Mood and Affect: Mood is not anxious or depressed.        Speech: Speech normal.        Behavior: Behavior normal. Behavior is cooperative.         Thought Content: Thought  content normal.        Judgment: Judgment normal.       Results for orders placed or performed in visit on 05/30/19  Novel Coronavirus, NAA ([facility])   Specimen: Nasopharyngeal(NP) swabs in vial transport medium   NASOPHARYNGE  TESTING  Result Value Ref Range   SARS-CoV-2, NAA Not Detected Not Detected     COVID 19 screen:  No recent travel or known exposure to COVID19 The patient denies respiratory symptoms of COVID 19 at this time. The importance of social distancing was discussed today.   Assessment and Plan   The patient's preventative maintenance and recommended screening tests for an annual wellness exam were reviewed in full today. Brought up to date unless services declined.  Counselled on the importance of diet, exercise, and its role in overall health and mortality. The patient's FH and SH was reviewed, including their home life, tobacco status, and drug and alcohol status.    Vaccines: uptodate, consider shingrix Pap/DVE:2022 per GYN Mammo: 2022 per GYN Bone Density: osteopenia, Dr. Rana Snare Colon: cologuard at Dr. Vance Gather office  Smoking Status:none ETOH/ drug KKX:FGHW./EXHB  HIV screen:   refused Problem List Items Addressed This Visit     BMI 27.0-27.9,adult    Encouraged exercise, weight loss, healthy eating habits.       Hypothyroidism    Chronic, followed by alternative medicine clinic.  She is on levothyroxine and Armour Thyroid but is transitioning to a new thyroid medication.      Relevant Medications   levothyroxine (SYNTHROID) 25 MCG tablet   MDD (major depressive disorder), recurrent episode, mild (HCC)    Chronic, well controlled on Celexa 20 mg p.o. daily but she feels she has had associated side effect of weight gain.  She is interested in changing to an medication but does not have this weight gain as associated side effects. She will decrease to half tablet daily of Celexa for 3 days then if doing well she will  transition to venlafaxine 37.5 mg daily.  She will continue this for 1 week, if tolerated she will increase to 75 mg daily.  She will follow-up in 4 to 5 weeks for reevaluation of mood and side effects on the new regimen.      Relevant Medications   venlafaxine XR (EFFEXOR-XR) 37.5 MG 24 hr capsule   Menopausal syndrome    She is followed at an alternative medicine clinic and is maintained on progesterone.      PND (post-nasal drip)    Improved control on Zyrtec daily.      Other Visit Diagnoses     Routine general medical examination at a health care facility    -  Primary   Screening cholesterol level       Relevant Orders   Lipid panel   Need for hepatitis C screening test       Relevant Orders   Hepatitis C antibody        Kerby Nora, MD

## 2021-11-25 ENCOUNTER — Encounter: Payer: Self-pay | Admitting: Family Medicine

## 2021-12-17 ENCOUNTER — Other Ambulatory Visit: Payer: Self-pay | Admitting: Family Medicine

## 2021-12-17 ENCOUNTER — Encounter: Payer: Self-pay | Admitting: Family Medicine

## 2021-12-17 NOTE — Telephone Encounter (Signed)
Last ov 11/24/21 Last filled 11/24/21 Last ov 12/29/21

## 2021-12-29 ENCOUNTER — Other Ambulatory Visit (INDEPENDENT_AMBULATORY_CARE_PROVIDER_SITE_OTHER): Payer: BC Managed Care – PPO

## 2021-12-29 ENCOUNTER — Ambulatory Visit: Payer: BC Managed Care – PPO | Admitting: Family Medicine

## 2021-12-29 ENCOUNTER — Encounter: Payer: Self-pay | Admitting: Family Medicine

## 2021-12-29 VITALS — BP 120/80 | HR 73 | Temp 98.4°F | Ht 64.0 in | Wt 160.2 lb

## 2021-12-29 DIAGNOSIS — Z13220 Encounter for screening for lipoid disorders: Secondary | ICD-10-CM

## 2021-12-29 DIAGNOSIS — F330 Major depressive disorder, recurrent, mild: Secondary | ICD-10-CM | POA: Diagnosis not present

## 2021-12-29 DIAGNOSIS — Z1159 Encounter for screening for other viral diseases: Secondary | ICD-10-CM

## 2021-12-29 DIAGNOSIS — R310 Gross hematuria: Secondary | ICD-10-CM | POA: Diagnosis not present

## 2021-12-29 LAB — LIPID PANEL
Cholesterol: 197 mg/dL (ref 0–200)
HDL: 62.1 mg/dL (ref >39.00)
LDL Cholesterol: 113 mg/dL — ABNORMAL HIGH (ref 0–99)
NonHDL: 135.32
Total CHOL/HDL Ratio: 3
Triglycerides: 111 mg/dL (ref 0.0–149.0)
VLDL: 22.2 mg/dL (ref 0.0–40.0)

## 2021-12-29 LAB — POC URINALSYSI DIPSTICK (AUTOMATED)
Bilirubin, UA: NEGATIVE
Blood, UA: NEGATIVE
Glucose, UA: NEGATIVE
Leukocytes, UA: NEGATIVE
Nitrite, UA: NEGATIVE
Protein, UA: NEGATIVE
Spec Grav, UA: 1.015 (ref 1.010–1.025)
Urobilinogen, UA: 0.2 E.U./dL
pH, UA: 6.5 (ref 5.0–8.0)

## 2021-12-29 NOTE — Progress Notes (Signed)
Patient ID: Jill Boone, female    DOB: 01/22/1971, 51 y.o.   MRN: [id number]  This visit was conducted in person.  BP 120/80   Pulse 73   Temp 98.4 F (36.9 C) (Oral)   Ht _0  (1.626 m)   Wt 160 lb 4 oz (72.7 kg)   LMP 07/02/2013   SpO2 97%   BMI 27.51 kg/m    CC:  Chief Complaint  Patient presents with   Follow-up    Depression     Subjective:   HPI: Jill Boone is a 50 y.o. female presenting on 12/29/2021 for Follow-up (Depression/)  MDD, chronic: At last office visit on July 25 we discussed side effects she was having with Celexa.  We transitioned her off the Celexa and onto venlafaxine 37.5 mg daily.  She felt issues with headache, dizziness, blurred vision.. this has all now resolved after  longer time on the venlafaxine.  PHQ9: 7... felt worse mood with transition. Now improved.  Wt Readings from Last 3 Encounters:  12/29/21 160 lb 4 oz (72.7 kg)  11/24/21 160 lb 2 oz (72.6 kg)  08/21/21 161 lb 6 oz (73.2 kg)     She  did see intermittent blood in urine, small amount.. now resolved. No dysuria, no frequency  Relevant past medical, surgical, family and social history reviewed and updated as indicated. Interim medical history since our last visit reviewed. Allergies and medications reviewed and updated. Outpatient Medications Prior to Visit  Medication Sig Dispense Refill   ARMOUR THYROID 60 MG tablet Take 60 mg by mouth daily.     Berberine Chloride (BERBERINE HCI PO) Take 450 mg by mouth daily.     Calcium Carbonate-Vit D-Min (CALCIUM 1200 PO) Take 1 tablet by mouth daily.     cetirizine-pseudoephedrine (ZYRTEC-D) 5-120 MG tablet Take 1 tablet by mouth 2 (two) times daily. 180 tablet 1   cyclobenzaprine (FLEXERIL) 5 MG tablet Take 5 mg by mouth at bedtime as needed.      DHEA 25 MG CAPS Take 1 capsule by mouth daily.     estradiol (ESTRACE) 1 MG tablet Take 3 mg by mouth daily.     levothyroxine (SYNTHROID) 25 MCG tablet Take 12.5 mcg by mouth every  morning.     Magnesium 500 MG TABS Take 1 tablet by mouth daily.     progesterone (PROMETRIUM) 100 MG capsule Take 300 mg by mouth at bedtime.     venlafaxine XR (EFFEXOR-XR) 37.5 MG 24 hr capsule Take 37.5 mg by mouth daily with breakfast.     vitamin B-12 (CYANOCOBALAMIN) 1000 MCG tablet Take 1,000 mcg by mouth daily.     Vitamin D-Vitamin K (K2 PLUS D3 PO) Take by mouth daily. 750 iu/200 mcg     venlafaxine XR (EFFEXOR-XR) 37.5 MG 24 hr capsule 1 TABLET DAILY X 1 WEEK THEN INCREASE TO 2 TABLETS DAILY. (Patient taking differently: Take 37.5 mg by mouth daily with breakfast.) 180 capsule 2   No facility-administered medications prior to visit.     Per HPI unless specifically indicated in ROS section below Review of Systems  Constitutional:  Negative for fatigue and fever.  HENT:  Negative for congestion.   Eyes:  Negative for pain.  Respiratory:  Negative for cough and shortness of breath.   Cardiovascular:  Negative for chest pain, palpitations and leg swelling.  Gastrointestinal:  Negative for abdominal pain.  Genitourinary:  Negative for dysuria and vaginal bleeding.  Musculoskeletal:  Negative for  back pain.  Neurological:  Negative for syncope, light-headedness and headaches.  Psychiatric/Behavioral:  Negative for dysphoric mood.    Objective:  BP 120/80   Pulse 73   Temp 98.4 F (36.9 C) (Oral)   Ht _0  (1.626 m)   Wt 160 lb 4 oz (72.7 kg)   LMP 07/02/2013   SpO2 97%   BMI 27.51 kg/m   Wt Readings from Last 3 Encounters:  12/29/21 160 lb 4 oz (72.7 kg)  11/24/21 160 lb 2 oz (72.6 kg)  08/21/21 161 lb 6 oz (73.2 kg)      Physical Exam Constitutional:      General: She is not in acute distress.    Appearance: Normal appearance. She is well-developed. She is not ill-appearing or toxic-appearing.  HENT:     Head: Normocephalic.     Right Ear: Hearing, tympanic membrane, ear canal and external ear normal. Tympanic membrane is not erythematous, retracted or bulging.      Left Ear: Hearing, tympanic membrane, ear canal and external ear normal. Tympanic membrane is not erythematous, retracted or bulging.     Nose: No mucosal edema or rhinorrhea.     Right Sinus: No maxillary sinus tenderness or frontal sinus tenderness.     Left Sinus: No maxillary sinus tenderness or frontal sinus tenderness.     Mouth/Throat:     Pharynx: Uvula midline.  Eyes:     General: Lids are normal. Lids are everted, no foreign bodies appreciated.     Conjunctiva/sclera: Conjunctivae normal.     Pupils: Pupils are equal, round, and reactive to light.  Neck:     Thyroid: No thyroid mass or thyromegaly.     Vascular: No carotid bruit.     Trachea: Trachea normal.  Cardiovascular:     Rate and Rhythm: Normal rate and regular rhythm.     Pulses: Normal pulses.     Heart sounds: Normal heart sounds, S1 normal and S2 normal. No murmur heard.    No friction rub. No gallop.  Pulmonary:     Effort: Pulmonary effort is normal. No tachypnea or respiratory distress.     Breath sounds: Normal breath sounds. No decreased breath sounds, wheezing, rhonchi or rales.  Abdominal:     General: Bowel sounds are normal.     Palpations: Abdomen is soft.     Tenderness: There is no abdominal tenderness.  Musculoskeletal:     Cervical back: Normal range of motion and neck supple.  Skin:    General: Skin is warm and dry.     Findings: No rash.  Neurological:     Mental Status: She is alert.  Psychiatric:        Mood and Affect: Mood is not anxious or depressed.        Speech: Speech normal.        Behavior: Behavior normal. Behavior is cooperative.        Thought Content: Thought content normal.        Judgment: Judgment normal.       Results for orders placed or performed in visit on 11/24/21  HM MAMMOGRAPHY  Result Value Ref Range   HM Mammogram 0-4 Bi-Rad 0-4 Bi-Rad, Self Reported Normal  HM DEXA SCAN  Result Value Ref Range   HM Dexa Scan Osteopenia   CBC and differential   Result Value Ref Range   Hemoglobin 13.4 12.0 - 16.0   HCT 38 36 - 46   Neutrophils Absolute 2.50  Platelets 230 150 - 400 K/uL   WBC 4.9   CBC  Result Value Ref Range   RBC 4.36 3.87 - 5.11  VITAMIN D 25 Hydroxy (Vit-D Deficiency, Fractures)  Result Value Ref Range   Vit D, 25-Hydroxy 16.3   Basic metabolic panel  Result Value Ref Range   Glucose 84    BUN 9 4 - 21   CO2 23 (A) 13 - 22   Creatinine 0.9 0.5 - 1.1   Potassium 4.0 3.5 - 5.1 mEq/L   Sodium 140 137 - 147   Chloride 103 99 - 108  Comprehensive metabolic panel  Result Value Ref Range   Globulin 2.2    eGFR 80    Calcium 9.3 8.7 - 10.7   Albumin 4.8 3.5 - 5.0  Hepatic function panel  Result Value Ref Range   Alkaline Phosphatase 96 25 - 125   ALT 8 7 - 35 U/L   AST 17 13 - 35   Bilirubin, Total 4.0   Hemoglobin A1c  Result Value Ref Range   Hemoglobin A1C 5.3   TSH  Result Value Ref Range   TSH 0.42 0.41 - 5.90  HM PAP SMEAR  Result Value Ref Range   HM Pap smear Negative for Intraepithelial lesions or malignancy   Results Console HPV  Result Value Ref Range   CHL HPV Negative      COVID 19 screen:  No recent travel or known exposure to COVID19 The patient denies respiratory symptoms of COVID 19 at this time. The importance of social distancing was discussed today.   Assessment and Plan    Problem List Items Addressed This Visit     Gross hematuria    Patient states she noted this as a side effect of venlafaxine.  It has now resolved per patient's visual exam, we will verify this with urinalysis.  She denies signs and symptoms of UTI.      MDD (major depressive disorder), recurrent episode, mild (HCC) - Primary    Chronic, well controlled on new venlafaxine 37.5 mg daily. She did have a difficult transition to the venlafaxine with significant initial side effects including hematuria. She states they have resolved entirely now.  Given she is doing well on the current medication although her  mood symptoms are slightly worse she does not want to increase the medication at this time.  We discussed behavioral treatment of mood issues and regular exercise.      Relevant Medications   venlafaxine XR (EFFEXOR-XR) 37.5 MG 24 hr capsule     Eliezer Lofts, MD

## 2021-12-29 NOTE — Assessment & Plan Note (Signed)
Chronic, well controlled on new venlafaxine 37.5 mg daily. She did have a difficult transition to the venlafaxine with significant initial side effects including hematuria. She states they have resolved entirely now.  Given she is doing well on the current medication although her mood symptoms are slightly worse she does not want to increase the medication at this time.  We discussed behavioral treatment of mood issues and regular exercise.

## 2021-12-29 NOTE — Assessment & Plan Note (Signed)
Patient states she noted this as a side effect of venlafaxine.  It has now resolved per patient's visual exam, we will verify this with urinalysis.  She denies signs and symptoms of UTI.

## 2021-12-29 NOTE — Patient Instructions (Signed)
Continue current dose of venlafaxine.. call if you feel we need to increase it over the next several months.  Continue regular exercise and heart healthy diet.

## 2021-12-29 NOTE — Addendum Note (Signed)
Addended by: Damita Lack on: 12/29/2021 10:36 AM   Modules accepted: Orders

## 2021-12-30 LAB — HEPATITIS C ANTIBODY: Hepatitis C Ab: NONREACTIVE

## 2022-01-04 ENCOUNTER — Encounter: Payer: Self-pay | Admitting: Family Medicine

## 2022-01-04 NOTE — Telephone Encounter (Signed)
Sign order below if okay to send in Rx for Venlafaxine XR 37.5 mg.

## 2022-01-05 MED ORDER — VENLAFAXINE HCL ER 75 MG PO CP24: 75.0000 mg | ORAL_CAPSULE | Freq: Every day | ORAL | 2 refills | Status: DC

## 2022-01-28 ENCOUNTER — Other Ambulatory Visit: Payer: Self-pay | Admitting: Family Medicine

## 2022-02-22 ENCOUNTER — Encounter: Payer: Self-pay | Admitting: Internal Medicine

## 2022-02-22 ENCOUNTER — Ambulatory Visit: Payer: BC Managed Care – PPO | Admitting: Internal Medicine

## 2022-02-22 ENCOUNTER — Encounter: Payer: Self-pay | Admitting: *Deleted

## 2022-02-22 VITALS — BP 122/84 | HR 86 | Temp 97.7°F | Ht 64.0 in | Wt 162.0 lb

## 2022-02-22 DIAGNOSIS — N950 Postmenopausal bleeding: Secondary | ICD-10-CM | POA: Diagnosis not present

## 2022-02-22 DIAGNOSIS — R319 Hematuria, unspecified: Secondary | ICD-10-CM | POA: Diagnosis not present

## 2022-02-22 LAB — POC URINALSYSI DIPSTICK (AUTOMATED)
Bilirubin, UA: NEGATIVE
Glucose, UA: NEGATIVE
Ketones, UA: NEGATIVE
Leukocytes, UA: NEGATIVE
Nitrite, UA: NEGATIVE
Protein, UA: NEGATIVE
Spec Grav, UA: 1.015 (ref 1.010–1.025)
Urobilinogen, UA: 0.2 E.U./dL
pH, UA: 6.5 (ref 5.0–8.0)

## 2022-02-22 NOTE — Progress Notes (Signed)
Subjective:    Patient ID: Jill Boone, female    DOB: 29-Oct-1970, 51 y.o.   MRN: [id number]  HPI Here due to blood in her urine  Meds changed in August Got some spotting---bled lightly for 3 days Then it resolved---but now has recurred  Seems to be vaginal Spotting on underwear, etc  Has estrogen from "[facility]" Now on 2mg  Was started on this for exhaustion, etc (and menopause) Is on progesterone also Menopause ~10 years ago  On estrogen x 2.5 years Was bumped up to 3mg  at her last visit---but caused breast tenderness so back to the 2mg   No dysuria No urgency No history of kidney stones  Current Outpatient Medications on File Prior to Visit  Medication Sig Dispense Refill   Berberine Chloride (BERBERINE HCI PO) Take 450 mg by mouth daily.     Calcium Carbonate-Vit D-Min (CALCIUM 1200 PO) Take 1 tablet by mouth daily.     cetirizine-pseudoephedrine (ZYRTEC-D) 5-120 MG tablet Take 1 tablet by mouth 2 (two) times daily. 180 tablet 1   cyclobenzaprine (FLEXERIL) 5 MG tablet Take 5 mg by mouth at bedtime as needed.      DHEA 25 MG CAPS Take 1 capsule by mouth daily.     estradiol (ESTRACE) 1 MG tablet Take 3 mg by mouth daily.     levothyroxine (SYNTHROID) 25 MCG tablet Take 12.5 mcg by mouth every morning.     Magnesium 500 MG TABS Take 1 tablet by mouth daily.     progesterone (PROMETRIUM) 100 MG capsule Take 300 mg by mouth at bedtime.     venlafaxine XR (EFFEXOR-XR) 75 MG 24 hr capsule TAKE 1 CAPSULE BY MOUTH DAILY WITH BREAKFAST. 90 capsule 0   vitamin B-12 (CYANOCOBALAMIN) 1000 MCG tablet Take 1,000 mcg by mouth daily.     Vitamin D-Vitamin K (K2 PLUS D3 PO) Take by mouth daily. 750 iu/200 mcg     No current facility-administered medications on file prior to visit.    Allergies  Allergen Reactions   Latex Itching and Rash    Past Medical History:  Diagnosis Date   Depression    Urinary incontinence     Past Surgical History:  Procedure  Laterality Date   Bladder tack  2010    Family History  Problem Relation Age of Onset   Heart disease Father    Cancer Paternal Grandmother     Social History   Socioeconomic History   Marital status: Married    Spouse name:  Wille Glaser   Number of children: 2   Years of education: BA   Highest education level: Not on file  Occupational History   Not on file  Tobacco Use   Smoking status: Never   Smokeless tobacco: Never  Substance and Sexual Activity   Alcohol use: No   Drug use: No   Sexual activity: Not on file  Other Topics Concern   Not on file  Social History Narrative   2 children, Teressa Senter and Brewing technologist, both patients   Active members in [place]   Married   Social Determinants of Health   Financial Resource Strain: Not on file  Food Insecurity: Not on file  Transportation Needs: Not on file  Physical Activity: Not on file  Stress: Not on file  Social Connections: Not on file  Intimate Partner Violence: Not on file   Review of Systems No fever No N/V Pap this summer was normal     Objective:  Physical Exam Constitutional:      Appearance: Normal appearance.  Abdominal:     Palpations: Abdomen is soft.     Tenderness: There is no abdominal tenderness. There is no right CVA tenderness or left CVA tenderness.  Neurological:     Mental Status: She is alert.            Assessment & Plan:

## 2022-02-22 NOTE — Assessment & Plan Note (Signed)
Might be related to the hormonal therapy--but will check pelvic ultrasounds Back to gyn depending on the results Discussed weaning the estrogen slowly to find the lowest effective dose

## 2022-03-02 ENCOUNTER — Ambulatory Visit
Admission: RE | Admit: 2022-03-02 | Discharge: 2022-03-02 | Disposition: A | Discharge status: Home / Self Care | Payer: BC Managed Care – PPO | Source: Ambulatory Visit | Attending: Internal Medicine | Admitting: Internal Medicine

## 2022-03-02 DIAGNOSIS — N950 Postmenopausal bleeding: Secondary | ICD-10-CM | POA: Insufficient documentation

## 2022-03-03 ENCOUNTER — Encounter: Payer: Self-pay | Admitting: Internal Medicine

## 2022-03-04 DIAGNOSIS — F32A Depression, unspecified: Secondary | ICD-10-CM | POA: Insufficient documentation

## 2022-04-25 ENCOUNTER — Other Ambulatory Visit: Payer: Self-pay | Admitting: Family Medicine

## 2022-07-30 ENCOUNTER — Other Ambulatory Visit: Payer: Self-pay | Admitting: Family Medicine

## 2022-08-04 ENCOUNTER — Encounter: Payer: Self-pay | Admitting: Family Medicine

## 2022-08-04 ENCOUNTER — Ambulatory Visit: Payer: BC Managed Care – PPO | Admitting: Family Medicine

## 2022-08-04 VITALS — BP 112/84 | HR 80 | Temp 97.1°F | Ht 64.0 in | Wt 162.0 lb

## 2022-08-04 DIAGNOSIS — F330 Major depressive disorder, recurrent, mild: Secondary | ICD-10-CM | POA: Diagnosis not present

## 2022-08-04 DIAGNOSIS — N950 Postmenopausal bleeding: Secondary | ICD-10-CM | POA: Diagnosis not present

## 2022-08-04 MED ORDER — BUPROPION HCL ER (XL) 150 MG PO TB24: 150.0000 mg | ORAL_TABLET | Freq: Every day | ORAL | 3 refills | Status: DC

## 2022-08-04 NOTE — Assessment & Plan Note (Signed)
Chronic, well-controlled at this time on venlafaxine but will change given possible side effects.  Had concerns about weight gain with Celexa in the past.  We will start Wellbutrin 150 mg XL p.o. daily. She will call/MyChart with update on mood in 3 to 4 weeks.  Return and ER precautions provided.

## 2022-08-04 NOTE — Assessment & Plan Note (Signed)
Acute, she has seen gynecology for postmenopausal bleeding.  Repeat ultrasound did not show endometrial stripe thickening and so biopsy was not performed.  Of note she is on estrogen and progesterone that could be contributing to her dysfunctional uterine bleeding. Venlafaxine also has associated abnormal vaginal bleeding side effect.  We will try to make a change to this medication but if her symptoms or not improving she will return to see gynecology for further evaluation.

## 2022-08-04 NOTE — Progress Notes (Signed)
Patient ID: Jill Boone, female    DOB: 05-13-1970, 52 y.o.   MRN: [id number]  This visit was conducted in person.  BP 112/84 (BP Location: Left Arm, Patient Position: Sitting, Cuff Size: Normal)   Pulse 80   Temp (!) 97.1 F (36.2 C) (Temporal)   Ht 5\' 4"  (1.626 m)   Wt 162 lb (73.5 kg)   LMP 07/02/2013   SpO2 99%   BMI 27.81 kg/m    CC:  Chief Complaint  Patient presents with   Vaginal Bleeding    Since starting effexor. Patient has done two ultrasounds and seen gyn about this. Progesterone was decreased but bleeding is still occurring. Patient would like to discuss changing anti-depressant.     Subjective:   HPI: Jill Boone is a 52 y.o. female presenting on 08/04/2022 for Vaginal Bleeding (Since starting effexor. Patient has done two ultrasounds and seen gyn about this. Progesterone was decreased but bleeding is still occurring. Patient would like to discuss changing anti-depressant. )   Possible SE to venlafaxine (started November 24, 2021)..  Stated on PA as a side effect <1% of time: abnormal vaginal uterine bleeding, heavy menstrual bleeding.   OV 02/23/23 for post menopausal bleeding  Transvaginal pelvic US:  thickened endometrial stripe  She is also on estrogen and progesterone per [facility] Reviewed last OV with Dr. Corinna Capra  GYN 03/2022. Repeat US showed normal lining.  He recommended decrease dose of progesterone. She reports bleeding has not stopped. She has monthly bleeding for 2-3 days a month,  spotting/light bleeding.  Menopause 10 years ago.   MDD previously well-controlled on Celexa 20 mg p.o. daily but felt side effect of weight gain.  Wt Readings from Last 3 Encounters:  08/04/22 162 lb (73.5 kg)  02/22/22 162 lb (73.5 kg)  12/29/21 160 lb 4 oz (72.7 kg)       08/04/2022   12:26 PM 12/29/2021   10:26 AM 11/24/2021   12:23 PM  Depression screen PHQ 2/9  Decreased Interest 0 1 0  Down, Depressed, Hopeless 0 1 0  PHQ - 2 Score 0 2 0   Altered sleeping 1 2 1   Tired, decreased energy 1 2 1   Change in appetite 1 1 1   Feeling bad or failure about yourself  0 1 0  Trouble concentrating 1 1 1   Moving slowly or fidgety/restless 0 0 0  Suicidal thoughts 0 0 0  PHQ-9 Score 4 9 4   Difficult doing work/chores Not difficult at all Somewhat difficult Not difficult at all       08/04/2022   12:26 PM  GAD 7 : Generalized Anxiety Score  Nervous, Anxious, on Edge 0  Control/stop worrying 0  Worry too much - different things 0  Trouble relaxing 0  Restless 0  Easily annoyed or irritable 1  Afraid - awful might happen 0  Total GAD 7 Score 1  Anxiety Difficulty Not difficult at all          Relevant past medical, surgical, family and social history reviewed and updated as indicated. Interim medical history since our last visit reviewed. Allergies and medications reviewed and updated. Outpatient Medications Prior to Visit  Medication Sig Dispense Refill   Berberine Chloride (BERBERINE HCI PO) Take 450 mg by mouth daily.     Calcium Carbonate-Vit D-Min (CALCIUM 1200 PO) Take 1 tablet by mouth daily.     cetirizine-pseudoephedrine (ZYRTEC-D) 5-120 MG tablet Take 1 tablet by mouth 2 (  two) times daily. 180 tablet 1   cyclobenzaprine (FLEXERIL) 5 MG tablet Take 5 mg by mouth at bedtime as needed.      DHEA 25 MG CAPS Take 1 capsule by mouth daily.     estradiol (ESTRACE) 1 MG tablet Take 3 mg by mouth daily.     levothyroxine (SYNTHROID) 25 MCG tablet Take 12.5 mcg by mouth every morning.     Magnesium 500 MG TABS Take 1 tablet by mouth daily.     progesterone (PROMETRIUM) 100 MG capsule Take 100 mg by mouth at bedtime.     venlafaxine XR (EFFEXOR-XR) 75 MG 24 hr capsule TAKE 1 CAPSULE BY MOUTH DAILY WITH BREAKFAST. 90 capsule 0   vitamin B-12 (CYANOCOBALAMIN) 1000 MCG tablet Take 1,000 mcg by mouth daily.     Vitamin D-Vitamin K (K2 PLUS D3 PO) Take by mouth daily. 750 iu/200 mcg     No facility-administered medications  prior to visit.     Per HPI unless specifically indicated in ROS section below Review of Systems  Constitutional:  Negative for fatigue and fever.  HENT:  Negative for congestion.   Eyes:  Negative for pain.  Respiratory:  Negative for cough and shortness of breath.   Cardiovascular:  Negative for chest pain, palpitations and leg swelling.  Gastrointestinal:  Negative for abdominal pain.  Genitourinary:  Negative for dysuria and vaginal bleeding.  Musculoskeletal:  Negative for back pain.  Neurological:  Negative for syncope, light-headedness and headaches.  Psychiatric/Behavioral:  Negative for dysphoric mood.    Objective:  BP 112/84 (BP Location: Left Arm, Patient Position: Sitting, Cuff Size: Normal)   Pulse 80   Temp (!) 97.1 F (36.2 C) (Temporal)   Ht 5\' 4"  (1.626 m)   Wt 162 lb (73.5 kg)   LMP 07/02/2013   SpO2 99%   BMI 27.81 kg/m   Wt Readings from Last 3 Encounters:  08/04/22 162 lb (73.5 kg)  02/22/22 162 lb (73.5 kg)  12/29/21 160 lb 4 oz (72.7 kg)      Physical Exam Constitutional:      General: She is not in acute distress.    Appearance: Normal appearance. She is well-developed. She is not ill-appearing or toxic-appearing.  HENT:     Head: Normocephalic.     Right Ear: Hearing, tympanic membrane, ear canal and external ear normal. Tympanic membrane is not erythematous, retracted or bulging.     Left Ear: Hearing, tympanic membrane, ear canal and external ear normal. Tympanic membrane is not erythematous, retracted or bulging.     Nose: No mucosal edema or rhinorrhea.     Right Sinus: No maxillary sinus tenderness or frontal sinus tenderness.     Left Sinus: No maxillary sinus tenderness or frontal sinus tenderness.     Mouth/Throat:     Pharynx: Uvula midline.  Eyes:     General: Lids are normal. Lids are everted, no foreign bodies appreciated.     Conjunctiva/sclera: Conjunctivae normal.     Pupils: Pupils are equal, round, and reactive to light.   Neck:     Thyroid: No thyroid mass or thyromegaly.     Vascular: No carotid bruit.     Trachea: Trachea normal.  Cardiovascular:     Rate and Rhythm: Normal rate and regular rhythm.     Pulses: Normal pulses.     Heart sounds: Normal heart sounds, S1 normal and S2 normal. No murmur heard.    No friction rub. No gallop.  Pulmonary:  Effort: Pulmonary effort is normal. No tachypnea or respiratory distress.     Breath sounds: Normal breath sounds. No decreased breath sounds, wheezing, rhonchi or rales.  Abdominal:     General: Bowel sounds are normal.     Palpations: Abdomen is soft.     Tenderness: There is no abdominal tenderness.  Musculoskeletal:     Cervical back: Normal range of motion and neck supple.  Skin:    General: Skin is warm and dry.     Findings: No rash.  Neurological:     Mental Status: She is alert.  Psychiatric:        Mood and Affect: Mood is not anxious or depressed.        Speech: Speech normal.        Behavior: Behavior normal. Behavior is cooperative.        Thought Content: Thought content normal.        Judgment: Judgment normal.       Results for orders placed or performed in visit on 02/22/22  POCT Urinalysis Dipstick (Automated)  Result Value Ref Range   Color, UA yellow    Clarity, UA clear    Glucose, UA Negative Negative   Bilirubin, UA negative    Ketones, UA negative    Spec Grav, UA 1.015 1.010 - 1.025   Blood, UA 3+    pH, UA 6.5 5.0 - 8.0   Protein, UA Negative Negative   Urobilinogen, UA 0.2 0.2 or 1.0 E.U./dL   Nitrite, UA negative    Leukocytes, UA Negative Negative    Assessment and Plan  Post-menopausal bleeding Assessment & Plan: Acute, she has seen gynecology for postmenopausal bleeding.  Repeat ultrasound did not show endometrial stripe thickening and so biopsy was not performed.  Of note she is on estrogen and progesterone that could be contributing to her dysfunctional uterine bleeding. Venlafaxine also has  associated abnormal vaginal bleeding side effect.  We will try to make a change to this medication but if her symptoms or not improving she will return to see gynecology for further evaluation.   MDD (major depressive disorder), recurrent episode, mild Assessment & Plan: Chronic, well-controlled at this time on venlafaxine but will change given possible side effects.  Had concerns about weight gain with Celexa in the past.  We will start Wellbutrin 150 mg XL p.o. daily. She will call/MyChart with update on mood in 3 to 4 weeks.  Return and ER precautions provided.   Other orders -     buPROPion HCl ER (XL); Take 1 tablet (150 mg total) by mouth daily.  Dispense: 30 tablet; Refill: 3    No follow-ups on file.   Eliezer Lofts, MD

## 2022-08-04 NOTE — Patient Instructions (Addendum)
Wean off venlafaxine to 37.5 mg daily as directed.  Start Wellbutrin 150 mg XL daily.  Call with update  on with mood in 3-4 week.  Follow up with GYN  if post menopausal bleeding not improving.

## 2022-08-10 ENCOUNTER — Encounter: Payer: Self-pay | Admitting: Family Medicine

## 2022-08-10 MED ORDER — VENLAFAXINE HCL ER 37.5 MG PO CP24: 37.5000 mg | ORAL_CAPSULE | Freq: Every day | ORAL | 0 refills | Status: DC

## 2022-08-10 NOTE — Telephone Encounter (Signed)
Venlafaxine 37.5 mg not on currently mediatation list.  Okay to send in?  If so, please sign order below.

## 2022-08-26 ENCOUNTER — Other Ambulatory Visit: Payer: Self-pay | Admitting: Family Medicine

## 2022-08-29 ENCOUNTER — Encounter: Payer: Self-pay | Admitting: Family Medicine

## 2022-09-28 ENCOUNTER — Other Ambulatory Visit: Payer: Self-pay | Admitting: Family Medicine

## 2022-10-05 ENCOUNTER — Encounter: Payer: Self-pay | Admitting: Family Medicine

## 2022-11-02 ENCOUNTER — Telehealth: Payer: Self-pay | Admitting: Family Medicine

## 2022-11-02 NOTE — Telephone Encounter (Signed)
Pt called stating she has some vaginal bleeding, nausea, headaches & experiencing some dizziness. Pt stated she had been treated by Dr. Ermalene Searing before for vaginal bleeding. Scheduled pt with Dr. Ermalene Searing for tomorrow, 7/3 (per pt's request). Transferred pt to access nurse. Call back # [phone]

## 2022-11-02 NOTE — Telephone Encounter (Signed)
Per appt notes pt already has appt with Dr Ermalene Searing 11/03/22 at 8:20 AM. Sending note to Dr Ermalene Searing and [place] [facility].

## 2022-11-03 ENCOUNTER — Encounter: Payer: Self-pay | Admitting: Family Medicine

## 2022-11-03 ENCOUNTER — Ambulatory Visit: Payer: BC Managed Care – PPO | Admitting: Family Medicine

## 2022-11-03 VITALS — BP 124/82 | HR 73 | Temp 97.2°F | Ht 64.0 in | Wt 158.0 lb

## 2022-11-03 DIAGNOSIS — N950 Postmenopausal bleeding: Secondary | ICD-10-CM

## 2022-11-03 DIAGNOSIS — R42 Dizziness and giddiness: Secondary | ICD-10-CM | POA: Insufficient documentation

## 2022-11-03 DIAGNOSIS — R110 Nausea: Secondary | ICD-10-CM | POA: Insufficient documentation

## 2022-11-03 DIAGNOSIS — R519 Headache, unspecified: Secondary | ICD-10-CM | POA: Diagnosis not present

## 2022-11-03 NOTE — Progress Notes (Signed)
Patient ID: Jill Boone, female    DOB: 1971/03/14, 52 y.o.   MRN: [id number]  This visit was conducted in person.  BP 124/82 (BP Location: Left Arm, Patient Position: Sitting, Cuff Size: Normal)   Pulse 73   Temp (!) 97.2 F (36.2 C) (Temporal)   Ht 5\' 4"  (1.626 m)   Wt 158 lb (71.7 kg)   LMP 07/02/2013   SpO2 99%   BMI 27.12 kg/m    CC:  Chief Complaint  Patient presents with   Nausea    Headaches, dizziness and vaginal bleeding since August 2023. Patient states this is ongoing and getting worse.     Subjective:   HPI: Jill Boone is a 52 y.o. female presenting on 11/03/2022 for Nausea (Headaches, dizziness and vaginal bleeding since August 2023. Patient states this is ongoing and getting worse. )  Summary of recent history:  Possible SE to venlafaxine (started November 24, 2021)..  Stated on PA as a side effect <1% of time: abnormal vaginal uterine bleeding, heavy menstrual bleeding.   OV 02/23/23 for post menopausal bleeding  Transvaginal pelvic US:  thickened endometrial stripe  She is also on estrogen and progesterone per [facility] Reviewed last OV with Dr. Rana Snare  GYN 03/2022. Repeat US showed normal lining.  He recommended decrease dose of progesterone. She reports bleeding has not stopped. She has monthly bleeding for 2-3 days a month,  spotting/light bleeding.  Menopause 10 years ago.     11/03/2022    8:24 AM 08/04/2022   12:26 PM 12/29/2021   10:26 AM  Depression screen PHQ 2/9  Decreased Interest 1 0 1  Down, Depressed, Hopeless 1 0 1  PHQ - 2 Score 2 0 2  Altered sleeping 0 1 2  Tired, decreased energy 1 1 2   Change in appetite 0 1 1  Feeling bad or failure about yourself  1 0 1  Trouble concentrating 0 1 1  Moving slowly or fidgety/restless 0 0 0  Suicidal thoughts 0 0 0  PHQ-9 Score 4 4 9   Difficult doing work/chores Not difficult at all Not difficult at all Somewhat difficult    Today she reports that she stopped the venlafaxine as  we discussed in April and changed to Wellbutrin 150 mg XL daily.  She had no vaginal bleeding in April or May 2024 and felt well.  She did note some fluctuating emotions tearfulness and requested increase in Wellbutrin to 300 mg on June 4 through MyChart. She feels that her mood improved some at that time.  She presents today because vaginal bleeding returned June 12 through 16 heavier, but 1-2 pad per day and again lighter June 26 and 27. Now in the last couple of weeks she is having hot flashes, headaches and unrelenting nausea.  She is also noted acne recur.  Yesterday triggered her to come to make an appointment here given she had a new symptom of vertigo.  This lasted several hours upon waking in the morning.  She had labs done with her integrative care doctor who is treating her with progesterone and liothyronine  and levothyroxine.  Labs were reviewed in detail today and for the most part were in the normal range.  Hemoglobin was 13 WBCs were 6.9, normal complete metabolic panel, vitamin D, B12 T4 was slightly low per their range at 0.68 but TSH and free T3 were normal.  Estradiol was undetectable and progesterone was 1.2 which is down from 6.1  at last check in November before her dose was decreased.      Relevant past medical, surgical, family and social history reviewed and updated as indicated. Interim medical history since our last visit reviewed. Allergies and medications reviewed and updated. Outpatient Medications Prior to Visit  Medication Sig Dispense Refill   Berberine Chloride (BERBERINE HCI PO) Take 450 mg by mouth daily.     buPROPion (WELLBUTRIN XL) 150 MG 24 hr tablet TAKE 1 TABLET BY MOUTH EVERY DAY (Patient taking differently: Take 300 mg by mouth daily.) 90 tablet 0   Calcium Carbonate-Vit D-Min (CALCIUM 1200 PO) Take 1 tablet by mouth daily.     cetirizine-pseudoephedrine (ZYRTEC-D) 5-120 MG tablet Take 1 tablet by mouth 2 (two) times daily. 180 tablet 1    cyclobenzaprine (FLEXERIL) 5 MG tablet Take 5 mg by mouth at bedtime as needed.      DHEA 25 MG CAPS Take 1 capsule by mouth daily.     estradiol (ESTRACE) 1 MG tablet Take 3 mg by mouth daily.     levothyroxine (SYNTHROID) 25 MCG tablet Take 12.5 mcg by mouth every morning.     Magnesium 500 MG TABS Take 1 tablet by mouth daily.     progesterone (PROMETRIUM) 100 MG capsule Take 100 mg by mouth at bedtime.     vitamin B-12 (CYANOCOBALAMIN) 1000 MCG tablet Take 1,000 mcg by mouth daily.     Vitamin D-Vitamin K (K2 PLUS D3 PO) Take by mouth daily. 750 iu/200 mcg     venlafaxine XR (EFFEXOR XR) 37.5 MG 24 hr capsule Take 1 capsule (37.5 mg total) by mouth daily with breakfast. (Patient not taking: Reported on 11/03/2022) 10 capsule 0   venlafaxine XR (EFFEXOR-XR) 75 MG 24 hr capsule TAKE 1 CAPSULE BY MOUTH DAILY WITH BREAKFAST. (Patient not taking: Reported on 11/03/2022) 90 capsule 0   No facility-administered medications prior to visit.     Per HPI unless specifically indicated in ROS section below Review of Systems  Constitutional:  Negative for fatigue and fever.  HENT:  Negative for congestion. Facial swelling: nausea.  Eyes:  Negative for pain.  Respiratory:  Negative for cough and shortness of breath.   Cardiovascular:  Negative for chest pain, palpitations and leg swelling.  Gastrointestinal:  Negative for abdominal pain.  Genitourinary:  Negative for dysuria and vaginal bleeding.  Musculoskeletal:  Negative for back pain.  Neurological:  Negative for syncope, light-headedness and headaches.  Psychiatric/Behavioral:  Negative for dysphoric mood.    Objective:  BP 124/82 (BP Location: Left Arm, Patient Position: Sitting, Cuff Size: Normal)   Pulse 73   Temp (!) 97.2 F (36.2 C) (Temporal)   Ht 5\' 4"  (1.626 m)   Wt 158 lb (71.7 kg)   LMP 07/02/2013   SpO2 99%   BMI 27.12 kg/m   Wt Readings from Last 3 Encounters:  11/03/22 158 lb (71.7 kg)  08/04/22 162 lb (73.5 kg)  02/22/22  162 lb (73.5 kg)      Physical Exam Constitutional:      General: She is not in acute distress.    Appearance: Normal appearance. She is well-developed. She is not ill-appearing or toxic-appearing.  HENT:     Head: Normocephalic.     Right Ear: Hearing, tympanic membrane, ear canal and external ear normal. Tympanic membrane is not erythematous, retracted or bulging.     Left Ear: Hearing, tympanic membrane, ear canal and external ear normal. Tympanic membrane is not erythematous, retracted or bulging.  Nose: No mucosal edema or rhinorrhea.     Right Sinus: No maxillary sinus tenderness or frontal sinus tenderness.     Left Sinus: No maxillary sinus tenderness or frontal sinus tenderness.     Mouth/Throat:     Pharynx: Uvula midline.  Eyes:     General: Lids are normal. Lids are everted, no foreign bodies appreciated.     Conjunctiva/sclera: Conjunctivae normal.     Pupils: Pupils are equal, round, and reactive to light.  Neck:     Thyroid: No thyroid mass or thyromegaly.     Vascular: No carotid bruit.     Trachea: Trachea normal.  Cardiovascular:     Rate and Rhythm: Normal rate and regular rhythm.     Pulses: Normal pulses.     Heart sounds: Normal heart sounds, S1 normal and S2 normal. No murmur heard.    No friction rub. No gallop.  Pulmonary:     Effort: Pulmonary effort is normal. No tachypnea or respiratory distress.     Breath sounds: Normal breath sounds. No decreased breath sounds, wheezing, rhonchi or rales.  Abdominal:     General: Bowel sounds are normal.     Palpations: Abdomen is soft.     Tenderness: There is no abdominal tenderness.  Musculoskeletal:     Cervical back: Normal range of motion and neck supple.  Skin:    General: Skin is warm and dry.     Findings: No rash.  Neurological:     Mental Status: She is alert.  Psychiatric:        Mood and Affect: Mood is not anxious or depressed.        Speech: Speech normal.        Behavior: Behavior normal.  Behavior is cooperative.        Thought Content: Thought content normal.        Judgment: Judgment normal.       Results for orders placed or performed in visit on 02/22/22  POCT Urinalysis Dipstick (Automated)  Result Value Ref Range   Color, UA yellow    Clarity, UA clear    Glucose, UA Negative Negative   Bilirubin, UA negative    Ketones, UA negative    Spec Grav, UA 1.015 1.010 - 1.025   Blood, UA 3+    pH, UA 6.5 5.0 - 8.0   Protein, UA Negative Negative   Urobilinogen, UA 0.2 0.2 or 1.0 E.U./dL   Nitrite, UA negative    Leukocytes, UA Negative Negative    Assessment and Plan  Post-menopausal bleeding  Acute nonintractable headache, unspecified headache type  Nausea  Vertigo   No clear new secondary cause of symptoms other than most likely hormonal fluctuation.  We discussed whether Wellbutrin could be giving her side effects and she does not feel it is.  She feels the Wellbutrin has helped more than hindered. I encouraged her to keep her appointment with integrative doctor for medication adjustment given I am I am not familiar with adjusting progesterone. I also encouraged her to consider following up with gynecology if the adjustments the integrative doctor makes do not help her feel better. No clear additional etiology of her symptoms.  No sign of allergies, normal neuroexam and brain mass or change in intracranial pressure unlikely. No clear current heartburn or stomach pain, GI change associated with the nausea.  We did discuss possibly weaning off the Wellbutrin to see if this could be causing her symptoms.   No follow-ups on  file.   Kerby Nora, MD

## 2022-11-09 ENCOUNTER — Encounter: Payer: Self-pay | Admitting: Family Medicine

## 2022-11-09 MED ORDER — BUPROPION HCL ER (XL) 300 MG PO TB24: 300.0000 mg | ORAL_TABLET | Freq: Every day | ORAL | 5 refills | Status: DC

## 2022-11-10 MED ORDER — BUPROPION HCL ER (XL) 150 MG PO TB24: 150.0000 mg | ORAL_TABLET | Freq: Every day | ORAL | 0 refills | Status: DC

## 2022-11-10 NOTE — Addendum Note (Signed)
Addended byKerby Nora E on: 11/10/2022 01:17 PM   Modules accepted: Orders

## 2023-02-01 LAB — HM MAMMOGRAPHY

## 2023-02-04 ENCOUNTER — Other Ambulatory Visit: Payer: Self-pay | Admitting: Family Medicine

## 2023-02-04 DIAGNOSIS — Z1211 Encounter for screening for malignant neoplasm of colon: Secondary | ICD-10-CM

## 2023-02-04 DIAGNOSIS — Z1212 Encounter for screening for malignant neoplasm of rectum: Secondary | ICD-10-CM

## 2023-02-23 LAB — COLOGUARD: COLOGUARD: NEGATIVE

## 2023-02-23 LAB — EXTERNAL GENERIC LAB PROCEDURE: COLOGUARD: NEGATIVE

## 2023-03-03 ENCOUNTER — Encounter: Payer: Self-pay | Admitting: Family Medicine

## 2023-03-03 MED ORDER — CITALOPRAM HYDROBROMIDE 10 MG PO TABS: 10.0000 mg | ORAL_TABLET | Freq: Every day | ORAL | 3 refills | Status: DC

## 2023-03-25 ENCOUNTER — Other Ambulatory Visit: Payer: Self-pay | Admitting: Family Medicine

## 2023-06-03 ENCOUNTER — Encounter: Payer: Self-pay | Admitting: Family Medicine

## 2023-06-03 ENCOUNTER — Other Ambulatory Visit: Payer: Self-pay | Admitting: Family Medicine

## 2023-06-03 NOTE — Telephone Encounter (Signed)
Last Fill: 03/25/23  Last OV: 11/03/22 Next OV: None Scheduled  Routing to provider for review/authorization.

## 2023-06-03 NOTE — Telephone Encounter (Signed)
Please schedule CPE with fasting labs prior with Dr. Ermalene Searing.  Send back to me once scheduled to refill medication.

## 2023-06-03 NOTE — Telephone Encounter (Signed)
 Call pt and schedule a appt for cpe

## 2023-06-03 NOTE — Telephone Encounter (Signed)
Copied from CRM [id number]. Topic: Clinical - Medication Refill >> Jun 03, 2023 10:34 AM Leavy Cella D wrote: Most Recent Primary Care Visit:  Provider: Kerby Nora E  Department: [facility]  Visit Type: OFFICE VISIT  Date: 11/03/2022  Medication: citalopram (CELEXA) 10 MG tablet (Patient would like 3 month supply)  Has the patient contacted their pharmacy? Yes (Agent: If no, request that the patient contact the pharmacy for the refill. If patient does not wish to contact the pharmacy document the reason why and proceed with request.) (Agent: If yes, when and what did the pharmacy advise?) Patient called and stated that pharmacy has sent in a request but I do not see request in patients account.   Is this the correct pharmacy for this prescription? Yes If no, delete pharmacy and type the correct one.  This is the patient's preferred pharmacy:  [id number] [place], [place] - [address] [place] [address] Phone: [phone] Fax: [phone]   Has the prescription been filled recently? No  Is the patient out of the medication? Yes  Has the patient been seen for an appointment in the last year OR does the patient have an upcoming appointment? Yes  Can we respond through MyChart? Yes  Agent: Please be advised that Rx refills may take up to 3 business days. We ask that you follow-up with your pharmacy.

## 2023-06-17 ENCOUNTER — Encounter: Payer: Self-pay | Admitting: Family Medicine

## 2023-06-23 ENCOUNTER — Ambulatory Visit (INDEPENDENT_AMBULATORY_CARE_PROVIDER_SITE_OTHER): Payer: Self-pay | Admitting: Family Medicine

## 2023-06-23 ENCOUNTER — Encounter: Payer: Self-pay | Admitting: Family Medicine

## 2023-06-23 VITALS — BP 120/80 | HR 72 | Temp 98.6°F | Ht 63.5 in | Wt 157.4 lb

## 2023-06-23 DIAGNOSIS — Z0000 Encounter for general adult medical examination without abnormal findings: Secondary | ICD-10-CM

## 2023-06-23 DIAGNOSIS — M533 Sacrococcygeal disorders, not elsewhere classified: Secondary | ICD-10-CM

## 2023-06-23 DIAGNOSIS — M545 Low back pain, unspecified: Secondary | ICD-10-CM

## 2023-06-23 DIAGNOSIS — N951 Menopausal and female climacteric states: Secondary | ICD-10-CM

## 2023-06-23 DIAGNOSIS — G8929 Other chronic pain: Secondary | ICD-10-CM

## 2023-06-23 DIAGNOSIS — F330 Major depressive disorder, recurrent, mild: Secondary | ICD-10-CM

## 2023-06-23 DIAGNOSIS — E039 Hypothyroidism, unspecified: Secondary | ICD-10-CM

## 2023-06-23 MED ORDER — PREDNISONE 20 MG PO TABS: ORAL_TABLET | ORAL | 0 refills | Status: DC

## 2023-06-23 MED ORDER — CYCLOBENZAPRINE HCL 10 MG PO TABS: 5.0000 mg | ORAL_TABLET | Freq: Every evening | ORAL | 0 refills | Status: DC | PRN

## 2023-06-23 NOTE — Assessment & Plan Note (Signed)
She is followed at an alternative medicine clinic and is maintained on progesterone/estradiol

## 2023-06-23 NOTE — Assessment & Plan Note (Signed)
Chronic, well-controlled   Celexa 10 mg daily

## 2023-06-23 NOTE — Assessment & Plan Note (Signed)
Chronic, encouraged her to not continue Celebrex daily as this can cause GI issues, kidney issues and heart issues.  She will limit use. Symptoms are most consistent stent with musculoskeletal low back pain, there is no clear evidence of nerve irritation or sciatica. We will have her try cyclobenzaprine 5 to 10 mg p.o. nightly for muscle spasm.  Referral placed for physical therapy.  We did discuss that given she has failed greater than 4 to 6 weeks of conservative treatment we can consider further imaging.  She would like to hold off at this time.

## 2023-06-23 NOTE — Progress Notes (Signed)
Patient ID: Jill Boone, female    DOB: 12-17-1970, 53 y.o.   MRN: [id number]  This visit was conducted in person.  BP 120/80 (BP Location: Left Arm, Patient Position: Sitting, Cuff Size: Normal)   Pulse 72   Temp 98.6 F (37 C) (Temporal)   Ht 5' 3.5" (1.613 m)   Wt 157 lb 6 oz (71.4 kg)   LMP 07/02/2013   SpO2 99%   BMI 27.44 kg/m    CC:  Chief Complaint  Patient presents with   Annual Exam    Subjective:   HPI: Jill Boone is a 53 y.o. female presenting on 06/23/2023 for Annual Exam  Reviewed labs from outside provider. Normal electrolytes, kidney and liver function.  TSH was elevated but direct measurement of free T3 and free T4 were normal.  CBC was within normal range.   No further post menopausal bleeding.  She is feeling better overall with mood   She has been on celebrex for chronic back pain.. has seen [facility]  Lumbar X-rays clear per pt. Referred to formal PT.  Doing home PT, deep needling and celebrex but still having pain in low back. Despite all this.  She  states pain has worsened in the last 2 months.. no new fall or change in activity.  Pain across low back bilaterally.. worse with sittig.  No radiation of pain  to legs.  No numbness or weakness in legs.  Reviewed last 05/2023 OV  at [facility] no specific recs.  Using cyclobenzaprine off and on for jaw spasm. Renal function normal with 05/18/2023 labs.   She is [facility] at [facility].Marland Kitchen given hormones, thyroid meds.  Diet:  healthy, she is eating whole foods Exercise: 2-3 times a week.  GYN: Dr.  Rana Snare.  MDD, recurrent, well controlled on celexa 10 mg daily    06/23/2023   12:02 PM 11/03/2022    8:24 AM 08/04/2022   12:26 PM  PHQ9 SCORE ONLY  PHQ-9 Total Score 0 4 4     Allergic rhinitis: Zyrtec D.  Blood pressure well controlled.   Hypothyroid, chronic, well controlled   Wt Readings from Last 3 Encounters:  06/23/23 157 lb 6 oz (71.4 kg)  11/03/22 158 lb (71.7 kg)   08/04/22 162 lb (73.5 kg)         Relevant past medical, surgical, family and social history reviewed and updated as indicated. Interim medical history since our last visit reviewed. Allergies and medications reviewed and updated. Outpatient Medications Prior to Visit  Medication Sig Dispense Refill   Berberine Chloride (BERBERINE HCI PO) Take 450 mg by mouth daily.     Calcium Carbonate-Vit D-Min (CALCIUM 1200 PO) Take 1 tablet by mouth daily.     celecoxib (CELEBREX) 200 MG capsule Take 200 mg by mouth daily.     citalopram (CELEXA) 10 MG tablet TAKE 1 TABLET BY MOUTH EVERY DAY 90 tablet 0   DHEA 25 MG CAPS Take 1 capsule by mouth daily.     estradiol (VIVELLE-DOT) 0.075 MG/24HR Place 1 patch onto the skin 2 (two) times a week.     levothyroxine (SYNTHROID) 75 MCG tablet Take 75 mcg by mouth every morning.     liothyronine (CYTOMEL) 5 MCG tablet Take 10 mcg by mouth every morning.     Magnesium 500 MG TABS Take 1 tablet by mouth daily.     progesterone (PROMETRIUM) 100 MG capsule Take 300 mg by mouth at bedtime.  vitamin B-12 (CYANOCOBALAMIN) 1000 MCG tablet Take 1,000 mcg by mouth daily.     Vitamin D-Vitamin K (K2 PLUS D3 PO) Take by mouth daily. 750 iu/200 mcg     cyclobenzaprine (FLEXERIL) 5 MG tablet Take 5 mg by mouth at bedtime as needed.      cetirizine-pseudoephedrine (ZYRTEC-D) 5-120 MG tablet Take 1 tablet by mouth 2 (two) times daily. 180 tablet 1   estradiol (ESTRACE) 1 MG tablet Take 3 mg by mouth daily.     levothyroxine (SYNTHROID) 25 MCG tablet Take 12.5 mcg by mouth every morning.     progesterone (PROMETRIUM) 100 MG capsule Take 100 mg by mouth at bedtime.     No facility-administered medications prior to visit.     Per HPI unless specifically indicated in ROS section below Review of Systems  Constitutional:  Negative for fatigue and fever.  HENT:  Negative for congestion.   Eyes:  Negative for pain.  Respiratory:  Negative for cough and shortness of  breath.   Cardiovascular:  Negative for chest pain, palpitations and leg swelling.  Gastrointestinal:  Negative for abdominal pain.  Genitourinary:  Negative for dysuria and vaginal bleeding.  Musculoskeletal:  Negative for back pain.  Neurological:  Negative for syncope, light-headedness and headaches.  Psychiatric/Behavioral:  Negative for dysphoric mood.    Objective:  BP 120/80 (BP Location: Left Arm, Patient Position: Sitting, Cuff Size: Normal)   Pulse 72   Temp 98.6 F (37 C) (Temporal)   Ht 5' 3.5" (1.613 m)   Wt 157 lb 6 oz (71.4 kg)   LMP 07/02/2013   SpO2 99%   BMI 27.44 kg/m   Wt Readings from Last 3 Encounters:  06/23/23 157 lb 6 oz (71.4 kg)  11/03/22 158 lb (71.7 kg)  08/04/22 162 lb (73.5 kg)      Physical Exam Constitutional:      General: She is not in acute distress.    Appearance: Normal appearance. She is well-developed. She is not ill-appearing or toxic-appearing.  HENT:     Head: Normocephalic.     Right Ear: Hearing, tympanic membrane, ear canal and external ear normal. Tympanic membrane is not erythematous, retracted or bulging.     Left Ear: Hearing, tympanic membrane, ear canal and external ear normal. Tympanic membrane is not erythematous, retracted or bulging.     Nose: Nose normal. No mucosal edema or rhinorrhea.     Right Sinus: No maxillary sinus tenderness or frontal sinus tenderness.     Left Sinus: No maxillary sinus tenderness or frontal sinus tenderness.     Mouth/Throat:     Pharynx: Uvula midline.  Eyes:     General: Lids are normal. Lids are everted, no foreign bodies appreciated.     Conjunctiva/sclera: Conjunctivae normal.     Pupils: Pupils are equal, round, and reactive to light.  Neck:     Thyroid: No thyroid mass or thyromegaly.     Vascular: No carotid bruit.     Trachea: Trachea normal.  Cardiovascular:     Rate and Rhythm: Normal rate and regular rhythm.     Pulses: Normal pulses.     Heart sounds: Normal heart sounds,  S1 normal and S2 normal. No murmur heard.    No friction rub. No gallop.  Pulmonary:     Effort: Pulmonary effort is normal. No tachypnea or respiratory distress.     Breath sounds: Normal breath sounds. No decreased breath sounds, wheezing, rhonchi or rales.  Abdominal:  General: Bowel sounds are normal. There is no distension or abdominal bruit.     Palpations: Abdomen is soft. There is no fluid wave or mass.     Tenderness: There is no abdominal tenderness. There is no guarding or rebound.     Hernia: No hernia is present.  Musculoskeletal:     Cervical back: Normal range of motion and neck supple.  Lymphadenopathy:     Cervical: No cervical adenopathy.  Skin:    General: Skin is warm and dry.     Findings: No rash.  Neurological:     Mental Status: She is alert.     Cranial Nerves: No cranial nerve deficit.     Sensory: No sensory deficit.  Psychiatric:        Mood and Affect: Mood is not anxious or depressed.        Speech: Speech normal.        Behavior: Behavior normal. Behavior is cooperative.        Thought Content: Thought content normal.        Judgment: Judgment normal.       Results for orders placed or performed in visit on 06/17/23  HM MAMMOGRAPHY   Collection Time: 02/01/23 12:00 AM  Result Value Ref Range   HM Mammogram 0-4 Bi-Rad 0-4 Bi-Rad, Self Reported Normal     COVID 19 screen:  No recent travel or known exposure to COVID19 The patient denies respiratory symptoms of COVID 19 at this time. The importance of social distancing was discussed today.   Assessment and Plan   The patient's preventative maintenance and recommended screening tests for an annual wellness exam were reviewed in full today. Brought up to date unless services declined.  Counselled on the importance of diet, exercise, and its role in overall health and mortality. The patient's FH and SH was reviewed, including their home life, tobacco status, and drug and alcohol status.     Vaccines: uptodate, consider shingrix and flu vaccine Pap/DVE:2022 per GYN Mammo: 02/01/2023 per GYN Bone Density:  2021 osteopenia, Dr. Rana Snare Colon: cologuard  02/17/2023 at Dr. Vance Gather office  Smoking Status:none ETOH/ drug ZOX:WRUE./AVWU  HIV screen:   refused Problem List Items Addressed This Visit     Chronic midline low back pain without sciatica   Chronic, encouraged her to not continue Celebrex daily as this can cause GI issues, kidney issues and heart issues.  She will limit use. Symptoms are most consistent stent with musculoskeletal low back pain, there is no clear evidence of nerve irritation or sciatica. We will have her try cyclobenzaprine 5 to 10 mg p.o. nightly for muscle spasm.  Referral placed for physical therapy.  We did discuss that given she has failed greater than 4 to 6 weeks of conservative treatment we can consider further imaging.  She would like to hold off at this time.      Relevant Medications   celecoxib (CELEBREX) 200 MG capsule   cyclobenzaprine (FLEXERIL) 10 MG tablet   predniSONE (DELTASONE) 20 MG tablet   Other Relevant Orders   Ambulatory referral to Physical Therapy   Hypothyroidism   Chronic, followed by alternative medicine clinic.  She is on levothyroxine 75 mcg daily and Cytomel.      Relevant Medications   levothyroxine (SYNTHROID) 75 MCG tablet   liothyronine (CYTOMEL) 5 MCG tablet   MDD (major depressive disorder), recurrent episode, mild (HCC)   Chronic, well-controlled   Celexa 10 mg daily      Menopausal  syndrome   She is followed at an alternative medicine clinic and is maintained on progesterone/estradiol      Sacroiliac joint pain   Acute, tenderness to palpation in the low back mainly centered over her sacroiliac joint bilaterally today in office.  Will try course of prednisone to decrease inflammation at this joint.  Will also refer her formally to physical therapy for additional treatment options.  Information provided  about SI joint and home stretches.      Relevant Orders   Ambulatory referral to Physical Therapy   Other Visit Diagnoses       Routine general medical examination at a health care facility    -  Primary         Kerby Nora, MD

## 2023-06-23 NOTE — Telephone Encounter (Signed)
 Pharmacy Updated.

## 2023-06-23 NOTE — Patient Instructions (Signed)
Continue deep needling. Consider massage therapy again.  Let me know if you want to move forward with formal physical therapy.  Can try muscle relaxant at night.  Can try prednisone taper.

## 2023-06-23 NOTE — Assessment & Plan Note (Addendum)
Chronic, followed by alternative medicine clinic.  She is on levothyroxine 75 mcg daily and Cytomel.

## 2023-06-23 NOTE — Assessment & Plan Note (Signed)
Acute, tenderness to palpation in the low back mainly centered over her sacroiliac joint bilaterally today in office.  Will try course of prednisone to decrease inflammation at this joint.  Will also refer her formally to physical therapy for additional treatment options.  Information provided about SI joint and home stretches.

## 2023-06-28 ENCOUNTER — Encounter: Payer: Self-pay | Admitting: Family Medicine

## 2023-06-28 NOTE — Telephone Encounter (Signed)
 This encounter was created in error - please disregard.

## 2023-06-30 ENCOUNTER — Other Ambulatory Visit: Payer: Self-pay

## 2023-06-30 NOTE — Telephone Encounter (Signed)
 Copied from CRM [id number]. Topic: Clinical - Medication Question >> Jun 30, 2023  2:52 PM Martinique E wrote: Reason for CRM: Bry from [facility] called stating that the patient is out of refills for 2 medications: cyclobenzaprine (FLEXERIL) 10 MG tablet predniSONE (DELTASONE) 20 MG tablet

## 2023-06-30 NOTE — Telephone Encounter (Signed)
 Refills are not appropriate. These are short term medications.

## 2023-07-01 MED ORDER — CITALOPRAM HYDROBROMIDE 10 MG PO TABS: 10.0000 mg | ORAL_TABLET | Freq: Every day | ORAL | 1 refills | Status: DC

## 2023-07-11 ENCOUNTER — Ambulatory Visit: Payer: Self-pay | Admitting: Family Medicine

## 2023-07-11 ENCOUNTER — Ambulatory Visit (INDEPENDENT_AMBULATORY_CARE_PROVIDER_SITE_OTHER): Payer: Self-pay | Admitting: Family Medicine

## 2023-07-11 ENCOUNTER — Encounter: Payer: Self-pay | Admitting: Family Medicine

## 2023-07-11 VITALS — BP 130/86 | HR 91 | Temp 98.5°F | Ht 63.5 in | Wt 154.0 lb

## 2023-07-11 DIAGNOSIS — E038 Other specified hypothyroidism: Secondary | ICD-10-CM

## 2023-07-11 DIAGNOSIS — R5383 Other fatigue: Secondary | ICD-10-CM

## 2023-07-11 LAB — POC COVID19 BINAXNOW: SARS Coronavirus 2 Ag: NEGATIVE

## 2023-07-11 NOTE — Progress Notes (Signed)
 Jill Shands T. Mildred Tuccillo, MD, CAQ Sports Medicine [facility] [address] [place], [address]  Phone: [phone]  FAX: [phone]  Jill Boone - 53 y.o. female  MRN [id number]  Date of Birth: [birth year]  Date: 07/11/2023  PCP: Excell Seltzer, MD  Referral: Excell Seltzer, MD  Chief Complaint  Patient presents with   Fatigue and Weakness    Saw Dr Ermalene Searing 2 weeks ago for CPE but did not have these symptoms. Started gradually last week. Really bad over the weekend. "Feel like my body shutdown". Denies fever chills, body aches, or other respiratory illness symptoms.   Subjective:   Jill Boone is a 53 y.o. very pleasant female patient with Body mass index is 26.85 kg/m. who presents with the following:  A couple of weeks ago saw Dr. Leonard Schwartz Used a muscle relaxer and pred for back issues.  She also sees integrative/[facility] in [place].  I did review her most recent lab work from [facility].  She does have a decreased TSH with normal free T3 and T4 levels.  The primary issue has been overwhelming fatigue.  Started pred ten days ago -  Tues or Wed started to feel really tired Thursday felt really comatose In the bed, exhausted, could get up and go to the bathroom. Mind and body felt like it was done.  Lightheaded some some  Had last week off, sanded her staircase - electric sander some, but she has not had any significant respiratory problems.  Yesterday and in the evening, watched some TV and was laughing some Small improvement Feels extremely tired No tick bites  Flexeril - took at night - 3 days  Thyroid -she was functionally hyperthyroid on her last thyroid hormone check  Hormone doses are the same  Started a couple of days after pred?  Check TSH T3. T4 Bmp Hfp a1c  Tick-borne illness? HIV, CMV, EBV B12 Blood sugar CBC Acute rheum unlikely  Hep C neg  Health Maintenance  Topic Date Due   INFLUENZA  VACCINE  08/01/2023 (Originally 12/02/2022)   Zoster Vaccines- Shingrix (1 of 2) 09/20/2023 (Originally 09/27/1987)   HIV Screening  06/22/2024 (Originally 09/27/1983)   DEXA SCAN  10/18/2024   DTaP/Tdap/Td (2 - Td or Tdap) 10/21/2024   MAMMOGRAM  01/31/2025   Cervical Cancer Screening (HPV/Pap Cotest)  10/29/2025   Fecal DNA (Cologuard)  02/16/2026   Hepatitis C Screening  Completed   HPV VACCINES  Aged Out   COVID-19 Vaccine  Discontinued     Review of Systems is noted in the HPI, as appropriate  Objective:   BP 130/86 (BP Location: Left Arm, Patient Position: Sitting, Cuff Size: Normal)   Pulse 91   Temp 98.5 F (36.9 C) (Oral)   Ht 5' 3.5" (1.613 m)   Wt 154 lb (69.9 kg)   LMP 07/02/2013   SpO2 98%   BMI 26.85 kg/m   GEN: No acute distress; alert,appropriate. CV: RRR, no m/g/r  PULM: Normal respiratory rate, no accessory muscle use. No wheezes, crackles or rhonchi  PSYCH: Normally interactive.   Laboratory and Imaging Data: COVID-19 testing is negative  Assessment and Plan:     ICD-10-CM   1. Other fatigue  R53.83 CBC with Differential/Platelet    COMPLETE METABOLIC PANEL WITH eGFR    Hemoglobin A1c    T3, free    T4, free    TSH    CANCELED: TSH    CANCELED: T3, free  CANCELED: T4, free    CANCELED: Hemoglobin A1c    CANCELED: COMPLETE METABOLIC PANEL WITH eGFR    CANCELED: CBC with Differential/Platelet    2. Other specified hypothyroidism  E03.8 T3, free    T4, free    TSH    CANCELED: TSH    CANCELED: T3, free    CANCELED: T4, free     Diffuse and fairly severe fatigue.  I do not have a very clear etiology.  Question possible reaction of prednisone, however this would be an atypical reaction. Multiple etiologies are possible, recheck CBC, BMP, HFP, and thyroid.  TSH was depressed on the last check, and she does take levothyroxine 75 mcg.  We did have to limit her lab workup to a degree, because she is self-pay and would like to keep bills at a  lower level.    This may all be from a viral syndrome, and thankfully she is feeling a little bit better yesterday and today.  Additional considerations such as tickborne illness, EBV, CMV, B12 and other issues possible.  She has no risk for HIV, hep C, or other acute blood-borne illnesses.  Risk profile is extremely low.  Medication Management during today's office visit: No orders of the defined types were placed in this encounter.  Medications Discontinued During This Encounter  Medication Reason   progesterone (PROMETRIUM) 100 MG capsule Completed Course    Orders placed today for conditions managed today: Orders Placed This Encounter  Procedures   CBC with Differential/Platelet   COMPLETE METABOLIC PANEL WITH eGFR   Hemoglobin A1c   T3, free   T4, free   TSH    Disposition: No follow-ups on file.  [employer] speech-to-text software was used for transcription in this dictation.  Possible transcriptional errors can occur using Animal nutritionist.   Signed,  Elpidio Galea. Cherlynn Popiel, MD   Outpatient Encounter Medications as of 07/11/2023  Medication Sig   Berberine Chloride (BERBERINE HCI PO) Take 450 mg by mouth daily.   Calcium Carbonate-Vit D-Min (CALCIUM 1200 PO) Take 1 tablet by mouth daily.   celecoxib (CELEBREX) 200 MG capsule Take 200 mg by mouth daily.   citalopram (CELEXA) 10 MG tablet Take 1 tablet (10 mg total) by mouth daily.   cyclobenzaprine (FLEXERIL) 10 MG tablet Take 0.5-1 tablets (5-10 mg total) by mouth at bedtime as needed.   DHEA 25 MG CAPS Take 1 capsule by mouth daily.   estradiol (VIVELLE-DOT) 0.075 MG/24HR Place 1 patch onto the skin 2 (two) times a week.   levothyroxine (SYNTHROID) 75 MCG tablet Take 75 mcg by mouth every morning.   liothyronine (CYTOMEL) 5 MCG tablet Take 10 mcg by mouth every morning.   Magnesium 500 MG TABS Take 1 tablet by mouth daily.   predniSONE (DELTASONE) 20 MG tablet 3 tabs by mouth daily x 3 days, then 2 tabs by  mouth daily x 2 days then 1 tab by mouth daily x 2 days   vitamin B-12 (CYANOCOBALAMIN) 1000 MCG tablet Take 1,000 mcg by mouth daily.   Vitamin D-Vitamin K (K2 PLUS D3 PO) Take by mouth daily. 750 iu/200 mcg   [DISCONTINUED] progesterone (PROMETRIUM) 100 MG capsule Take 300 mg by mouth at bedtime.   No facility-administered encounter medications on file as of 07/11/2023.

## 2023-07-11 NOTE — Addendum Note (Signed)
 Addended by: Eual Fines on: 07/11/2023 01:47 PM   Modules accepted: Orders

## 2023-07-11 NOTE — Telephone Encounter (Signed)
 Copied from CRM [id number]. Topic: Appointments - Appointment Scheduling >> Jul 11, 2023  8:09 AM Alcus Dad wrote: Patient/patient representative is calling to schedule an appointment. Red Word used... Patient stated she has been extremely fatigue since last Thursday. Patient stated that she has been light headed, very weak and tired   Chief Complaint: Weakness and Fatigue Symptoms: Fatigue, Weakness  Frequency: Since Last Thursday Pertinent Negatives: Patient denies any other symptoms  Disposition: [] ED /[] Urgent Care (no appt availability in office) / [x] Appointment(In office/virtual)/ []  [facility]/ [] Home Care/ [] Refused Recommended Disposition /[] [facility]/ []  Follow-up with PCP  Additional Notes: JL is being triaged for generalized weakness and fatigue. The patient reports being so tired that she takes days to do basic activities. The patient reports being able to get up and shower, eat, and drink but then immediately returns to bed to lay back down. The patient denies sleeping excessively, but lays there without any type of distraction.  In office appointment made for this morning with another provider in office per protocol.   Reason for Disposition  [1] MODERATE weakness (i.e., interferes with work, school, normal activities) AND [2] cause unknown  (Exceptions: Weakness from acute minor illness or poor fluid intake; weakness is chronic and not worse.)  Answer Assessment - Initial Assessment Questions 1. DESCRIPTION: "Describe how you are feeling."     "My body has shut down"  2. SEVERITY: "How bad is it?"  "Can you stand and walk?"   - MILD (0-3): Feels weak or tired, but does not interfere with work, school or normal activities.   - MODERATE (4-7): Able to stand and walk; weakness interferes with work, school, or normal activities.   - SEVERE (8-10): Unable to stand or walk; unable to do usual activities.     Moderate-Severe  3. ONSET: "When did these  symptoms begin?" (e.g., hours, days, weeks, months)     Since Last Wednesday  4. CAUSE: "What do you think is causing the weakness or fatigue?" (e.g., not drinking enough fluids, medical problem, trouble sleeping)       5. NEW MEDICINES:  "Have you started on any new medicines recently?" (e.g., opioid pain medicines, benzodiazepines, muscle relaxants, antidepressants, antihistamines, neuroleptics, beta blockers)     Prednisone, Muscle Relaxer, Started a week ago Sunday  6. OTHER SYMPTOMS: "Do you have any other symptoms?" (e.g., chest pain, fever, cough, SOB, vomiting, diarrhea, bleeding, other areas of pain)     Lightheadedness upon standing.   7. PREGNANCY: "Is there any chance you are pregnant?" "When was your last menstrual period?"     No  Protocols used: Weakness (Generalized) and Fatigue-A-AH

## 2023-07-12 ENCOUNTER — Ambulatory Visit: Payer: Self-pay | Admitting: Family Medicine

## 2023-07-12 ENCOUNTER — Telehealth: Payer: Self-pay

## 2023-07-12 DIAGNOSIS — E039 Hypothyroidism, unspecified: Secondary | ICD-10-CM

## 2023-07-12 LAB — COMPLETE METABOLIC PANEL WITH GFR
AG Ratio: 1.8 (calc) (ref 1.0–2.5)
ALT: 9 U/L (ref 6–29)
AST: 12 U/L (ref 10–35)
Albumin: 4.7 g/dL (ref 3.6–5.1)
Alkaline phosphatase (APISO): 89 U/L (ref 37–153)
BUN: 16 mg/dL (ref 7–25)
CO2: 27 mmol/L (ref 20–32)
Calcium: 9.7 mg/dL (ref 8.6–10.4)
Chloride: 100 mmol/L (ref 98–110)
Creat: 0.87 mg/dL (ref 0.50–1.03)
Globulin: 2.6 g/dL (ref 1.9–3.7)
Glucose, Bld: 90 mg/dL (ref 65–99)
Potassium: 5.5 mmol/L — ABNORMAL HIGH (ref 3.5–5.3)
Sodium: 137 mmol/L (ref 135–146)
Total Bilirubin: 0.5 mg/dL (ref 0.2–1.2)
Total Protein: 7.3 g/dL (ref 6.1–8.1)
eGFR: 79 mL/min/{1.73_m2} (ref >=60)

## 2023-07-12 LAB — CBC WITH DIFFERENTIAL/PLATELET
Absolute Lymphocytes: 2206 {cells}/uL (ref 850–3900)
Absolute Monocytes: 656 {cells}/uL (ref 200–950)
Basophils Absolute: 16 {cells}/uL (ref 0–200)
Basophils Relative: 0.2 %
Eosinophils Absolute: 139 {cells}/uL (ref 15–500)
Eosinophils Relative: 1.7 %
HCT: 45.8 % — ABNORMAL HIGH (ref 35.0–45.0)
Hemoglobin: 14.9 g/dL (ref 11.7–15.5)
MCH: 29.6 pg (ref 27.0–33.0)
MCHC: 32.5 g/dL (ref 32.0–36.0)
MCV: 90.9 fL (ref 80.0–100.0)
MPV: 11 fL (ref 7.5–12.5)
Monocytes Relative: 8 %
Neutro Abs: 5182 {cells}/uL (ref 1500–7800)
Neutrophils Relative %: 63.2 %
Platelets: 303 10*3/uL (ref 140–400)
RBC: 5.04 10*6/uL (ref 3.80–5.10)
RDW: 12.2 % (ref 11.0–15.0)
Total Lymphocyte: 26.9 %
WBC: 8.2 10*3/uL (ref 3.8–10.8)

## 2023-07-12 LAB — HEMOGLOBIN A1C
Hgb A1c MFr Bld: 5.4 %{Hb} (ref <5.7)
Mean Plasma Glucose: 108 mg/dL
eAG (mmol/L): 6 mmol/L

## 2023-07-12 LAB — TSH: TSH: 0.03 m[IU]/L — ABNORMAL LOW

## 2023-07-12 LAB — T3, FREE: T3, Free: 4.1 pg/mL (ref 2.3–4.2)

## 2023-07-12 LAB — T4, FREE: Free T4: 1 ng/dL (ref 0.8–1.8)

## 2023-07-12 NOTE — Telephone Encounter (Signed)
 Copied from CRM [id number]. Topic: Clinical - Medical Advice >> Jul 12, 2023  3:06 PM Alcus Dad wrote: Reason for CRM: Patient had labs done and have not heard back from anyone. She stated that she did receive results in MyChart but she wants to speak with someone about them

## 2023-07-13 MED ORDER — LEVOTHYROXINE SODIUM 50 MCG PO TABS: 50.0000 ug | ORAL_TABLET | Freq: Every day | ORAL | 3 refills | Status: DC

## 2023-07-13 NOTE — Telephone Encounter (Signed)
 Lupita Leash, can you check her pharmacy and send this in for me?  It looks like the one we have is in [place] - not sure if that is mail order.  Amy, [name] has not been doing well, and in our workup, her TSH was extremely low, so I think she is overcorrected with her Synthroid.  I am going to have her drop the dose to 50 micrograms, and I told her that you would follow-up with her about repeating the thyroid panel after she is on the new dose.   Lab Results  Component Value Date   TSH 0.03 (L) 07/11/2023

## 2023-07-13 NOTE — Telephone Encounter (Signed)
 Okay, thanks for seeing her. Jill Boone please tell her to return for either a follow-up appointment with me or at least labs to reevaluate thyroid function in 4 to 6 weeks. I have ordered the labs

## 2023-07-13 NOTE — Addendum Note (Signed)
 Addended by: Kerby Nora E on: 07/13/2023 01:49 PM   Modules accepted: Orders

## 2023-07-13 NOTE — Addendum Note (Signed)
 Addended by: Hannah Beat on: 07/13/2023 01:09 PM   Modules accepted: Orders

## 2023-07-13 NOTE — Telephone Encounter (Signed)
 Spoke with Jill Boone and scheduled lab appointment 08/10/2023 at 9:15 am to recheck thyroid labs and 08/11/2023 at 2:40 pm to follow up with Dr. Ermalene Searing.  She will cancel appointment with Dr. Ermalene Searing if she is doing better.

## 2023-07-20 ENCOUNTER — Telehealth: Payer: Self-pay | Admitting: Family Medicine

## 2023-07-20 NOTE — Telephone Encounter (Signed)
 Copied from CRM [id number]. Topic: General - Other >> Jul 20, 2023  9:13 AM Sim Boast F wrote: Reason for CRM: Patient had labs done 07/11/23 and she is self pay, [facility] needs verification that patient is self pay and needs the office to call [phone] to confirm

## 2023-07-20 NOTE — Telephone Encounter (Signed)
 Spoke to billing at [facility], they have issued a discounted bill for the patient.

## 2023-08-10 ENCOUNTER — Other Ambulatory Visit: Payer: Self-pay

## 2023-08-10 DIAGNOSIS — E039 Hypothyroidism, unspecified: Secondary | ICD-10-CM

## 2023-08-10 NOTE — Addendum Note (Signed)
 Addended by: Lovena Neighbours on: 08/10/2023 09:05 AM   Modules accepted: Orders

## 2023-08-11 ENCOUNTER — Encounter: Payer: Self-pay | Admitting: Family Medicine

## 2023-08-11 ENCOUNTER — Ambulatory Visit (INDEPENDENT_AMBULATORY_CARE_PROVIDER_SITE_OTHER): Payer: Self-pay | Admitting: Family Medicine

## 2023-08-11 VITALS — BP 138/88 | HR 79 | Temp 98.1°F | Ht 63.5 in | Wt 157.2 lb

## 2023-08-11 DIAGNOSIS — E039 Hypothyroidism, unspecified: Secondary | ICD-10-CM

## 2023-08-11 LAB — TSH: TSH: 0.04 m[IU]/L — ABNORMAL LOW

## 2023-08-11 LAB — T3, FREE: T3, Free: 3.9 pg/mL (ref 2.3–4.2)

## 2023-08-11 LAB — T4, FREE: Free T4: 0.8 ng/dL (ref 0.8–1.8)

## 2023-08-11 NOTE — Progress Notes (Signed)
 Patient ID: Jill Boone, female    DOB: 1970/10/03, 53 y.o.   MRN: [id number]  This visit was conducted in person.  BP 138/88 (BP Location: Left Arm, Patient Position: Sitting, Cuff Size: Normal)   Pulse 79   Temp 98.1 F (36.7 C) (Temporal)   Ht 5' 3.5" (1.613 m)   Wt 157 lb 4 oz (71.3 kg)   LMP 07/02/2013   SpO2 99%   BMI 27.42 kg/m    CC:  Chief Complaint  Patient presents with   Hypothyroidism    Follow up labs    Subjective:   HPI: Jill Boone is a 53 y.o. female presenting on 08/11/2023 for Hypothyroidism (Follow up labs)  Treated with thyroid medication and hormonal treatment at [facility]. Per lab reports TSH was decreased with normal free T3 and free T4 levels. ( Labs show that this has been like this for  6 months or more) She was previously doing well at recent physical in June 23, 2023. Had recent treatment with prednisone and muscle relaxant for back pain.  Per office visit with Dr. Patsy Lager July 11, 2023 she had noted 10 days of overwhelming fatigue. Felt possibly reaction to prednisone given symptoms started a few days after starting prednisone. CBC, basic metabolic panel and thyroid panel obtained. TSH was found to be low 0.03, with normal free T3 and free T4... Dr. Patsy Lager recommended decreasing levothyroxine to 50 mcg  Felt possible viral infection. But no cough congestion   . On recheck with thyroid function test yesterday TSH was somewhat better at 0.04, Free T33.9, free T4 0.8.  She stopped the prednisone 2 days early... continued to feel tired Today she reports she  has felt significantly better  after a few days. He feels that she has been abck to normal.  Good energy for her.  She is doing fine on lower dose of levothyroxine 50 mcg daily.  She is still on cytomel 5 mg daily.  She denies any symptoms of hyperthyroidism: No heat intolerance no anxiety, no jitteriness, no unexpected weight loss, no eye  bulging.  Relevant past medical, surgical, family and social history reviewed and updated as indicated. Interim medical history since our last visit reviewed. Allergies and medications reviewed and updated. Outpatient Medications Prior to Visit  Medication Sig Dispense Refill   Berberine Chloride (BERBERINE HCI PO) Take 450 mg by mouth daily.     Calcium Carbonate-Vit D-Min (CALCIUM 1200 PO) Take 1 tablet by mouth daily.     citalopram (CELEXA) 10 MG tablet Take 1 tablet (10 mg total) by mouth daily. 90 tablet 1   cyclobenzaprine (FLEXERIL) 10 MG tablet Take 0.5-1 tablets (5-10 mg total) by mouth at bedtime as needed. 15 tablet 0   DHEA 25 MG CAPS Take 1 capsule by mouth daily.     estradiol (VIVELLE-DOT) 0.075 MG/24HR Place 1 patch onto the skin 2 (two) times a week.     levothyroxine (SYNTHROID) 50 MCG tablet Take 1 tablet (50 mcg total) by mouth daily. 30 tablet 3   liothyronine (CYTOMEL) 5 MCG tablet Take 10 mcg by mouth every morning.     magnesium gluconate (MAGONATE) 500 (27 Mg) MG TABS tablet Take 500 mg by mouth daily.     Nutritional Supplements (NUTRITIONAL SUPPLEMENT PO) Perfect Amino 5g -Take 1 serving (5 tablets) by mouth daily     vitamin B-12 (CYANOCOBALAMIN) 1000 MCG tablet Take 1,000 mcg by mouth daily.  Vitamin D-Vitamin K (K2 PLUS D3 PO) Take by mouth daily. 750 iu/200 mcg     Magnesium 500 MG TABS Take 1 tablet by mouth daily.     celecoxib (CELEBREX) 200 MG capsule Take 200 mg by mouth daily.     predniSONE (DELTASONE) 20 MG tablet 3 tabs by mouth daily x 3 days, then 2 tabs by mouth daily x 2 days then 1 tab by mouth daily x 2 days 15 tablet 0   No facility-administered medications prior to visit.     Per HPI unless specifically indicated in ROS section below Review of Systems  Constitutional:  Negative for fatigue and fever.  HENT:  Negative for congestion.   Eyes:  Negative for pain.  Respiratory:  Negative for cough and shortness of breath.    Cardiovascular:  Negative for chest pain, palpitations and leg swelling.  Gastrointestinal:  Negative for abdominal pain.  Genitourinary:  Negative for dysuria and vaginal bleeding.  Musculoskeletal:  Negative for back pain.  Neurological:  Negative for syncope, light-headedness and headaches.  Psychiatric/Behavioral:  Negative for dysphoric mood.    Objective:  BP 138/88 (BP Location: Left Arm, Patient Position: Sitting, Cuff Size: Normal)   Pulse 79   Temp 98.1 F (36.7 C) (Temporal)   Ht 5' 3.5" (1.613 m)   Wt 157 lb 4 oz (71.3 kg)   LMP 07/02/2013   SpO2 99%   BMI 27.42 kg/m   Wt Readings from Last 3 Encounters:  08/11/23 157 lb 4 oz (71.3 kg)  07/11/23 154 lb (69.9 kg)  06/23/23 157 lb 6 oz (71.4 kg)      Physical Exam Constitutional:      General: She is not in acute distress.    Appearance: Normal appearance. She is well-developed. She is not ill-appearing or toxic-appearing.  HENT:     Head: Normocephalic.     Right Ear: Hearing, tympanic membrane, ear canal and external ear normal. Tympanic membrane is not erythematous, retracted or bulging.     Left Ear: Hearing, tympanic membrane, ear canal and external ear normal. Tympanic membrane is not erythematous, retracted or bulging.     Nose: No mucosal edema or rhinorrhea.     Right Sinus: No maxillary sinus tenderness or frontal sinus tenderness.     Left Sinus: No maxillary sinus tenderness or frontal sinus tenderness.     Mouth/Throat:     Pharynx: Uvula midline.  Eyes:     General: Lids are normal. Lids are everted, no foreign bodies appreciated.     Conjunctiva/sclera: Conjunctivae normal.     Pupils: Pupils are equal, round, and reactive to light.  Neck:     Thyroid: No thyroid mass or thyromegaly.     Vascular: No carotid bruit.     Trachea: Trachea normal.  Cardiovascular:     Rate and Rhythm: Normal rate and regular rhythm.     Pulses: Normal pulses.     Heart sounds: Normal heart sounds, S1 normal and  S2 normal. No murmur heard.    No friction rub. No gallop.  Pulmonary:     Effort: Pulmonary effort is normal. No tachypnea or respiratory distress.     Breath sounds: Normal breath sounds. No decreased breath sounds, wheezing, rhonchi or rales.  Abdominal:     General: Bowel sounds are normal.     Palpations: Abdomen is soft.     Tenderness: There is no abdominal tenderness.  Musculoskeletal:     Cervical back: Normal range of motion  and neck supple.  Skin:    General: Skin is warm and dry.     Findings: No rash.  Neurological:     Mental Status: She is alert.  Psychiatric:        Mood and Affect: Mood is not anxious or depressed.        Speech: Speech normal.        Behavior: Behavior normal. Behavior is cooperative.        Thought Content: Thought content normal.        Judgment: Judgment normal.       Results for orders placed or performed in visit on 08/10/23  T3, free   Collection Time: 08/10/23  9:05 AM  Result Value Ref Range   T3, Free 3.9 2.3 - 4.2 pg/mL  T4, free   Collection Time: 08/10/23  9:05 AM  Result Value Ref Range   Free T4 0.8 0.8 - 1.8 ng/dL  TSH   Collection Time: 08/10/23  9:05 AM  Result Value Ref Range   TSH 0.04 (L) mIU/L    Assessment and Plan  Hypothyroidism, unspecified type Assessment & Plan: Chronic, she has no symptoms of hyperthyroidism and her free T3 and free T4 are in the normal range on her current regimen.  I agree with decreasing levothyroxine to 50 mcg daily as well as continuing liothyronine 5 mcg daily. I think we can tolerate a suppressed TSH given direct measurements of thyroid hormones are normal and patient is asymptomatic.  Her extreme fatigue that she was having 1 month ago has resolved entirely.  This may have been a viral infection versus reaction to coming off of the prednisone.  Patient does report she was doing a lot of sanding without wearing a mask so may have had an inflammatory response.     No follow-ups on  file.   Kerby Nora, MD

## 2023-08-11 NOTE — Assessment & Plan Note (Signed)
 Chronic, she has no symptoms of hyperthyroidism and her free T3 and free T4 are in the normal range on her current regimen.  I agree with decreasing levothyroxine to 50 mcg daily as well as continuing liothyronine 5 mcg daily. I think we can tolerate a suppressed TSH given direct measurements of thyroid hormones are normal and patient is asymptomatic.  Her extreme fatigue that she was having 1 month ago has resolved entirely.  This may have been a viral infection versus reaction to coming off of the prednisone.  Patient does report she was doing a lot of sanding without wearing a mask so may have had an inflammatory response.

## 2023-08-15 ENCOUNTER — Other Ambulatory Visit: Payer: Self-pay | Admitting: Family Medicine

## 2023-08-15 NOTE — Telephone Encounter (Signed)
 Copied from CRM [id number]. Topic: Clinical - Medication Refill >> Aug 15, 2023  3:18 PM Orien Bird wrote: Most Recent Primary Care Visit:  Provider: Herby Lolling E  Department: [facility]  Visit Type: OFFICE VISIT  Date: 08/11/2023  Medication: cyclobenzaprine (FLEXERIL) 10 MG tablet  Has the patient contacted their pharmacy? Yes (Agent: If no, request that the patient contact the pharmacy for the refill. If patient does not wish to contact the pharmacy document the reason why and proceed with request.) (Agent: If yes, when and what did the pharmacy advise?)  Is this the correct pharmacy for this prescription? Yes If no, delete pharmacy and type the correct one.  This is the patient's preferred pharmacy:  [facility] - [place], [place] - [address] [place] [address] Phone: [phone] Fax: [phone]   Has the prescription been filled recently? No  Is the patient out of the medication? Yes  Has the patient been seen for an appointment in the last year OR does the patient have an upcoming appointment? Yes  Can we respond through MyChart? Yes  Agent: Please be advised that Rx refills may take up to 3 business days. We ask that you follow-up with your pharmacy.

## 2023-08-16 MED ORDER — CYCLOBENZAPRINE HCL 10 MG PO TABS
5.0000 mg | ORAL_TABLET | Freq: Every evening | ORAL | 0 refills | Status: DC | PRN
Start: 2023-08-16 — End: 2023-09-05

## 2023-09-05 ENCOUNTER — Other Ambulatory Visit: Payer: Self-pay | Admitting: Family Medicine

## 2023-09-05 NOTE — Telephone Encounter (Signed)
 Copied from CRM [id number]. Topic: Clinical - Medication Refill >> Sep 05, 2023 12:46 PM Chuck Crater wrote: Most Recent Primary Care Visit:  Provider: Herby Lolling E  Department: [facility]  Visit Type: OFFICE VISIT  Date: 08/11/2023  Medication: cyclobenzaprine  (FLEXERIL ) 10 MG tablet  Has the patient contacted their pharmacy? Yes (Agent: If no, request that the patient contact the pharmacy for the refill. If patient does not wish to contact the pharmacy document the reason why and proceed with request.) (Agent: If yes, when and what did the pharmacy advise?) no refills  Is this the correct pharmacy for this prescription? Yes If no, delete pharmacy and type the correct one.  This is the patient's preferred pharmacy:  [facility] - [place], [place] - [address] [place] [address] Phone: [phone] Fax: [phone]   Has the prescription been filled recently? No  Is the patient out of the medication? Yes  Has the patient been seen for an appointment in the last year OR does the patient have an upcoming appointment? Yes  Can we respond through MyChart? Yes  Agent: Please be advised that Rx refills may take up to 3 business days. We ask that you follow-up with your pharmacy.

## 2023-09-05 NOTE — Telephone Encounter (Signed)
 Last Fill: 08/16/23   Last OV: 08/11/23 Next OV: None Scheduled  Routing to provider for review/authorization.

## 2023-09-05 NOTE — Telephone Encounter (Signed)
 Last OV 08/11/23 for Hypothyroidism. Last refill 08/16/23 #15 w/ 0 refills. No future appts with PCP scheduled

## 2023-09-06 MED ORDER — CYCLOBENZAPRINE HCL 10 MG PO TABS
5.0000 mg | ORAL_TABLET | Freq: Every evening | ORAL | 0 refills | Status: DC | PRN
Start: 2023-09-06 — End: 2023-09-28

## 2023-09-28 ENCOUNTER — Other Ambulatory Visit: Payer: Self-pay | Admitting: Family Medicine

## 2023-09-28 MED ORDER — CYCLOBENZAPRINE HCL 10 MG PO TABS: 5.0000 mg | ORAL_TABLET | Freq: Every evening | ORAL | 0 refills | Status: DC | PRN

## 2023-09-28 NOTE — Telephone Encounter (Signed)
 Last office visit 08/11/23 for hypothyroidism.  Last refilled 09/06/2023 for #15 with no refills.  Next appt: No future appointments.

## 2023-10-04 ENCOUNTER — Other Ambulatory Visit: Payer: Self-pay | Admitting: Family Medicine

## 2023-10-04 MED ORDER — CYCLOBENZAPRINE HCL 10 MG PO TABS: 5.0000 mg | ORAL_TABLET | Freq: Every evening | ORAL | 0 refills | Status: DC | PRN

## 2023-10-04 NOTE — Telephone Encounter (Signed)
 Last office visit 08/11/2023 for hypothyroidism.  Last refilled 09/28/2023 for #15 with no refills.  Next appt: No future appointments.

## 2023-10-11 ENCOUNTER — Other Ambulatory Visit: Payer: Self-pay | Admitting: Family Medicine

## 2023-10-11 MED ORDER — LEVOTHYROXINE SODIUM 50 MCG PO TABS: 50.0000 ug | ORAL_TABLET | Freq: Every day | ORAL | 3 refills | Status: AC

## 2023-10-11 NOTE — Telephone Encounter (Signed)
 Copied from CRM [id number]. Topic: Clinical - Medication Refill >> Oct 11, 2023  2:48 PM Abigail D wrote: Medication: levothyroxine  (SYNTHROID ) 50 MCG tablet, requesting 90 day supply  Has the patient contacted their pharmacy? Yes, pharmacy contacted on behalf of patient (Agent: If no, request that the patient contact the pharmacy for the refill. If patient does not wish to contact the pharmacy document the reason why and proceed with request.) (Agent: If yes, when and what did the pharmacy advise?)  This is the patient's preferred pharmacy:  [facility] - [place], [place] - [address] [place] [address] Phone: [phone] Fax: [phone]  Is this the correct pharmacy for this prescription? Yes If no, delete pharmacy and type the correct one.   Has the prescription been filled recently? Yes  Is the patient out of the medication? No  Has the patient been seen for an appointment in the last year OR does the patient have an upcoming appointment? Yes  Can we respond through MyChart? No  Agent: Please be advised that Rx refills may take up to 3 business days. We ask that you follow-up with your pharmacy.

## 2023-10-12 ENCOUNTER — Other Ambulatory Visit: Payer: Self-pay | Admitting: Family Medicine

## 2023-10-12 MED ORDER — CYCLOBENZAPRINE HCL 10 MG PO TABS: 5.0000 mg | ORAL_TABLET | Freq: Every evening | ORAL | 0 refills | Status: AC | PRN

## 2023-10-12 NOTE — Telephone Encounter (Signed)
 Copied from CRM [id number]. Topic: Clinical - Medication Refill >> Oct 12, 2023 11:25 AM Clyde Darling P wrote: Medication: cyclobenzaprine  (FLEXERIL ) 10 MG tablet  Has the patient contacted their pharmacy? Yes- Pharmacy called in (Agent: If no, request that the patient contact the pharmacy for the refill. If patient does not wish to contact the pharmacy document the reason why and proceed with request.) (Agent: If yes, when and what did the pharmacy advise?)  This is the patient's preferred pharmacy:  [facility] - [place], [place] - [address] [place] [address] Phone: [phone] Fax: [phone]  Is this the correct pharmacy for this prescription? Yes If no, delete pharmacy and type the correct one.   Has the prescription been filled recently? No  Is the patient out of the medication? No- Pharmacy advise she is coming up due for a refill  Has the patient been seen for an appointment in the last year OR does the patient have an upcoming appointment? Yes  Can we respond through MyChart? Yes  Agent: Please be advised that Rx refills may take up to 3 business days. We ask that you follow-up with your pharmacy.

## 2023-10-12 NOTE — Telephone Encounter (Signed)
 Last office visit 08/11/2023 for hypothyroidism.  Last refilled 10/04/2023 for #15 with no refills.  Next appt: No future appointments.

## 2023-10-14 ENCOUNTER — Other Ambulatory Visit: Payer: Self-pay | Admitting: Family Medicine

## 2023-10-14 NOTE — Telephone Encounter (Signed)
 Refill was sent on 10/12/2023.

## 2023-10-14 NOTE — Telephone Encounter (Signed)
 Copied from CRM [id number]. Topic: Clinical - Medication Refill >> Oct 14, 2023  3:00 PM Zipporah Him wrote: Medication: cyclobenzaprine  (FLEXERIL ) 10 MG tablet  Has the patient contacted their pharmacy? Yes  This is the patient's preferred pharmacy:  [facility] - [place], [place] - [address] [place] [address] Phone: [phone] Fax: [phone]  Is this the correct pharmacy for this prescription? Yes If no, delete pharmacy and type the correct one.   Has the prescription been filled recently? Yes  Is the patient out of the medication? No  Has the patient been seen for an appointment in the last year OR does the patient have an upcoming appointment? Yes  Can we respond through MyChart? Yes  Agent: Please be advised that Rx refills may take up to 3 business days. We ask that you follow-up with your pharmacy.

## 2023-11-18 ENCOUNTER — Encounter: Payer: Self-pay | Admitting: Advanced Practice Midwife

## 2024-01-04 ENCOUNTER — Other Ambulatory Visit: Payer: Self-pay | Admitting: Family Medicine

## 2024-01-04 NOTE — Telephone Encounter (Unsigned)
 Copied from CRM [id number]. Topic: Clinical - Medication Refill >> Jan 04, 2024  4:30 PM Rosina BIRCH wrote: Medication: citalopram  Has the patient contacted their pharmacy? No (Agent: If no, request that the patient contact the pharmacy for the refill. If patient does not wish to contact the pharmacy document the reason why and proceed with request.) (Agent: If yes, when and what did the pharmacy advise?)  This is the patient's preferred pharmacy:  [facility] - [place], [place] - [address] [place] [address] Phone: [phone] Fax: [phone]  Is this the correct pharmacy for this prescription? Yes If no, delete pharmacy and type the correct one.   Has the prescription been filled recently? no  Is the patient out of the medication? No  Has the patient been seen for an appointment in the last year OR does the patient have an upcoming appointment? Yes  Can we respond through MyChart? Yes  Agent: Please be advised that Rx refills may take up to 3 business days. We ask that you follow-up with your pharmacy.

## 2024-01-05 MED ORDER — CITALOPRAM HYDROBROMIDE 10 MG PO TABS: 10.0000 mg | ORAL_TABLET | Freq: Every day | ORAL | 1 refills | Status: DC

## 2024-02-06 ENCOUNTER — Encounter: Payer: Self-pay | Admitting: Family Medicine

## 2024-02-08 MED ORDER — CITALOPRAM HYDROBROMIDE 20 MG PO TABS: 20.0000 mg | ORAL_TABLET | Freq: Every day | ORAL | 1 refills | Status: AC
